# Patient Record
Sex: Male | Born: 1965 | Race: White | Hispanic: No | Marital: Married | Smoking: Former smoker
Health system: Southern US, Community
[De-identification: ages and names within clinical notes are randomized; demographics above are authoritative.]

## PROBLEM LIST (undated history)

## (undated) DIAGNOSIS — D332 Benign neoplasm of brain, unspecified: Secondary | ICD-10-CM

## (undated) DIAGNOSIS — I10 Essential (primary) hypertension: Secondary | ICD-10-CM

## (undated) DIAGNOSIS — F419 Anxiety disorder, unspecified: Secondary | ICD-10-CM

## (undated) HISTORY — DX: Anxiety disorder, unspecified: F41.9

## (undated) HISTORY — PX: NECK SURGERY: SHX720

## (undated) HISTORY — PX: HIP SURGERY: SHX245

## (undated) HISTORY — PX: VENTRICULOPERITONEAL SHUNT: SHX204

## (undated) HISTORY — PX: BRAIN SURGERY: SHX531

---

## 2004-09-01 ENCOUNTER — Emergency Department: Payer: Self-pay | Admitting: Emergency Medicine

## 2005-07-14 ENCOUNTER — Emergency Department: Payer: Self-pay | Admitting: Internal Medicine

## 2006-08-31 ENCOUNTER — Emergency Department: Payer: Self-pay | Admitting: Emergency Medicine

## 2006-11-07 ENCOUNTER — Inpatient Hospital Stay (HOSPITAL_COMMUNITY): Admission: AD | Admit: 2006-11-07 | Discharge: 2006-11-14 | Payer: Self-pay | Admitting: Psychiatry

## 2006-11-07 ENCOUNTER — Ambulatory Visit: Payer: Self-pay | Admitting: Psychiatry

## 2006-11-08 ENCOUNTER — Emergency Department (HOSPITAL_COMMUNITY): Admission: EM | Admit: 2006-11-08 | Discharge: 2006-11-08 | Payer: Self-pay | Admitting: Emergency Medicine

## 2006-12-04 ENCOUNTER — Emergency Department (HOSPITAL_COMMUNITY): Admission: EM | Admit: 2006-12-04 | Discharge: 2006-12-04 | Payer: Self-pay | Admitting: Emergency Medicine

## 2006-12-22 ENCOUNTER — Emergency Department: Payer: Self-pay | Admitting: Emergency Medicine

## 2007-01-10 ENCOUNTER — Ambulatory Visit: Payer: Self-pay | Admitting: Psychiatry

## 2007-01-10 ENCOUNTER — Inpatient Hospital Stay (HOSPITAL_COMMUNITY): Admission: AD | Admit: 2007-01-10 | Discharge: 2007-01-20 | Payer: Self-pay | Admitting: Psychiatry

## 2007-03-06 ENCOUNTER — Emergency Department: Payer: Self-pay

## 2007-04-14 ENCOUNTER — Emergency Department: Payer: Self-pay | Admitting: Emergency Medicine

## 2007-05-12 ENCOUNTER — Emergency Department: Payer: Self-pay | Admitting: Emergency Medicine

## 2007-06-17 ENCOUNTER — Emergency Department: Payer: Self-pay | Admitting: Emergency Medicine

## 2007-07-23 ENCOUNTER — Inpatient Hospital Stay: Payer: Self-pay | Admitting: Psychiatry

## 2007-08-09 ENCOUNTER — Emergency Department: Payer: Self-pay | Admitting: Emergency Medicine

## 2007-09-14 ENCOUNTER — Emergency Department: Payer: Self-pay

## 2007-09-19 ENCOUNTER — Other Ambulatory Visit: Payer: Self-pay

## 2007-09-19 ENCOUNTER — Inpatient Hospital Stay: Payer: Self-pay | Admitting: Unknown Physician Specialty

## 2008-02-04 ENCOUNTER — Emergency Department: Payer: Self-pay | Admitting: Emergency Medicine

## 2008-02-12 ENCOUNTER — Observation Stay: Payer: Self-pay | Admitting: Psychiatry

## 2008-03-05 ENCOUNTER — Emergency Department (HOSPITAL_COMMUNITY): Admission: EM | Admit: 2008-03-05 | Discharge: 2008-03-05 | Payer: Self-pay | Admitting: Emergency Medicine

## 2008-03-08 ENCOUNTER — Emergency Department (HOSPITAL_COMMUNITY): Admission: EM | Admit: 2008-03-08 | Discharge: 2008-03-08 | Payer: Self-pay | Admitting: Emergency Medicine

## 2009-10-08 ENCOUNTER — Emergency Department: Payer: Self-pay | Admitting: Unknown Physician Specialty

## 2009-10-08 ENCOUNTER — Emergency Department (HOSPITAL_COMMUNITY): Admission: EM | Admit: 2009-10-08 | Discharge: 2009-10-08 | Payer: Self-pay | Admitting: Emergency Medicine

## 2009-12-06 ENCOUNTER — Emergency Department: Payer: Self-pay | Admitting: Emergency Medicine

## 2009-12-08 ENCOUNTER — Inpatient Hospital Stay: Payer: Self-pay | Admitting: Psychiatry

## 2009-12-27 ENCOUNTER — Emergency Department: Payer: Self-pay | Admitting: Emergency Medicine

## 2009-12-31 ENCOUNTER — Emergency Department (HOSPITAL_COMMUNITY): Admission: EM | Admit: 2009-12-31 | Discharge: 2009-12-31 | Payer: Self-pay | Admitting: Emergency Medicine

## 2010-02-06 ENCOUNTER — Emergency Department: Payer: Self-pay | Admitting: Emergency Medicine

## 2010-03-08 ENCOUNTER — Emergency Department: Payer: Self-pay | Admitting: Emergency Medicine

## 2010-07-31 NOTE — Discharge Summary (Signed)
[name], [name]              ACCOUNT NO.:  [id number]   MEDICAL RECORD NO.:  [id number]          PATIENT TYPE:  IPS   LOCATION:  0604                          FACILITY:  [facility]   PHYSICIAN:  Jasmine Pang, M.D. DATE OF BIRTH:  [birth year]   DATE OF ADMISSION:  01/10/2007  DATE OF DISCHARGE:  01/20/2007                               DISCHARGE SUMMARY   IDENTIFICATION:  This is a voluntary admission to my service on January 10, 2007 for this 45 year old separated white male.   HISTORY OF PRESENT ILLNESS:  The patient presented yesterday as a walk-  in.  He reports he was having suicidal ideation again actually this is  because he was having command hallucinations.  He states he has been  using OxyContin again to self medicate.  He lost his wife, his home, and  his job within the past 6-8 months.  He states he recently found out  that his estranged wife is with another man.  Apparently, she had been  with him the whole time.  He lost his home to foreclosure.  He called  the hotline at [facility], and they advised the patient to come to  [facility].  He was last with [place] from November 07, 2006 to  November 14, 2006.  He had the same complaints at that time.  The only new  thing is that his primary physician is no longer prescribing opioids,  and he is buying them off the street.  He states he has an aunt who  abuses opioids.  He has a grandfather and uncle who abused alcohol.  His  own history is that he began using OxyContin at the age of 42.  This was  due to a brain injury.  He had a benign tumor removed at age 78 and age  85.  He then suffered fracture of the C1, C2, and C3 about 10 years ago  and began using oxycodone then.  His psychiatrist is Dr. Elesa Massed and he is  followed by Dr. Newell Coral, a neurosurgeon.  His primary care doctor is  Dr. Lacie Scotts.  He is status post resection of a benign brain tumor at  the age of 25 and 4.  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  It was checked about 6 months ago by Dr. Newell Coral and is patent.  He also suffers migraines and has a history of fracture for C1, C2, and  C3 as indicated above.  He is currently prescribed Protonix 40 mg daily,  Risperdal M-Tabs 0.5 mg p.o. b.i.d..  Risperdal M-Tabs 1 mg q.h.s.,  Celexa 40 mg daily, Relafen 750 mg b.i.d., Flexeril 10 mg t.i.d., Ambien  10 mg q.h.s., Xanax 0.5 mg p.o. t.i.d., Ultracet q.6h. p.r.n. pain,  Zyprexa 15 mg p.o. q.h.s., and Benadryl 25 mg p.o. q.h.s.   PHYSICAL FINDINGS:  The patient's physical exam was grossly within  normal limits.  There were no acute physical or medical problems noted.   ADMISSION LABORATORIES:  Comprehensive metabolic panel was remarkable  for potassium of 3.4 (3.5-5.1).  Glucose elevated at 111.  The  urinalysis was negative.  TSH was 0.409, which was within normal limits.  CBC was grossly within normal limits except for slightly decreased  hematocrit of 38.7 and magnesium was 2.5, which was within normal range.   HOSPITAL COURSE:  Upon admission, the patient was continued on Protonix  40 mg p.o. daily, Celexa 40 mg p.o. daily, Relafen 750 mg p.o. b.i.d.,  Flexeril 10 mg p.o. t.i.d., Ambien 10 mg p.o. q.h.s. p.r.n. insomnia,  Zyprexa 15 mg p.o. q.h.s., and Benadryl 25 mg p.o. q.h.s.  He was also  started on the Librium detox protocol due to his abuse of  benzodiazepines.  He was started on the clonidine detox protocol due to  his opiate abuse.  He was started on ibuprofen 600 mg t.i.d. p.r.n.  pain.  He was started on Zyprexa Zydis 5 mg p.o. q.6h. p.r.n. anxiety or  agitation..  On January 13, 2007, Zyprexa was discontinued.  He was  started on Seroquel 50 mg p.o. q.6h. p.r.n. hallucinations.  He was also  started on Seroquel 100 mg p.o. q.h.s.  On January 14, 2007, Seroquel  was increased to 100 mg p.o. b.i.d. and 100 mg q.h.s.  On January 15, 2007, Seroquel was increased to 150 mg p.o. b.i.d. and 150 mg at  bedtime.  On  January 16, 2007, Seroquel was increased to 200 mg p.o.  q.i.d.  The patient had requested these increases due to continued  auditory hallucinations.  The patient was friendly and cooperative in  sessions with me.  He did endorse auditory hallucinations.  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  He also lost his wife shortly afterwards and lost his home  to foreclosure.  He is living in one of his parents' home at this time.  He discussed his medical problems, which have been difficult for him  pain wise.  He admitted to suicidal ideation with voices telling him to  kill himself.  He stated that initially he complained that the voices  are bad.  There was saying things like shoot youself and get some  pills.  He stated they were telling him he should not be here.  This  continued as his hospitalization progressed.  On January 15, 2007, he  was finally becoming less depressed and less anxious.  The auditory  hallucinations were less with the increased Seroquel, but he still  requested an increased dose in a Seroquel as indicated above.  On  January 17, 2007, the patient was feeling that the voices were getting  better.  He had questions about when he could go home.  His sleep was  good.  His appetite was good.  As hospitalization progressed, there were  no significant auditory hallucinations.  He stated they have become  internal instead of external.  There was no suicidal or homicidal  ideation.  On January 19, 2007, he stated he felt better like somebody  turned on the light switch.  On January 20, 2007, mental status had  improved markedly from admission status.  The patient was less  depressed.  He was friendly and cooperative with good eye contact.  Speech was normal rate and flow.  Psychomotor activity was within normal  limits.  Mood was euthymic.  Affect wide range.  There was no suicidal  or homicidal ideation.  No thoughts of  self-injurious behavior.  No  auditory or visual hallucinations.  No paranoia or delusions.  Thoughts  were logical and goal directed.  Cognitive was grossly back to baseline.  It was felt the patient was safe to be discharged today.  He was going  to return to his parents' extra home that he has been living in.  He has  a roommate who is a support for him.   DISCHARGE DIAGNOSES:  AXIS I:  Psychotic disorder, not otherwise  specified.  Polysubstance dependence.  AXIS II:  None.  AXIS III:  Status post benign brain tumor resection ages 14 and 37,  status post fracture C1, C2, and C3 10 years ago, migraine headaches.  AXIS IV:  Severe (problems with primary support group, education,  occupation, housing, economic, medical problems, and burden of  psychiatric illness).  AXIS V:  Global assessment of functioning was 50 upon discharge.  Global  assessment of functioning was 30 upon admission.  Global assessment of  functioning was 65 highest past year.   DISCHARGE PLAN:  There were no specific activity level or dietary  restrictions.   POST-HOSPITAL CARE PLANS:  The patient will be seen by his psychiatrist,  Dr. Elesa Massed on January 20, 2007, at 3 o'clock p.m.  He will also be seen  soon by his therapist, Robet Leu.  This appointment is being arranged  by the case manager.   DISCHARGE MEDICATIONS:  1. Benadryl 25 mg p.o. q.h.s.  2. Flexeril 10 mg p.o. t.i.d.  3. Celexa 40 mg daily.  4. Protonix 40 mg daily with food.  5. Relafen 750 mg twice daily.  6. Seroquel 200 mg p.o. t.i.d. and Seroquel 50 mg p.o. q.6h. p.r.n.      anxiety or hallucinations.  7. Ambien 10 mg p.o. q.h.s. p.r.n.      Jasmine Pang, M.D.  Electronically Signed    BHS/MEDQ  D:  01/20/2007  T:  01/20/2007  Job:  [id number]

## 2010-07-31 NOTE — H&P (Signed)
[name], [name]              ACCOUNT NO.:  [id number]   MEDICAL RECORD NO.:  [id number]          PATIENT TYPE:  IPS   LOCATION:  0604                          FACILITY:  [facility]   PHYSICIAN:  Vic Ripper, P.A.-C.DATE OF BIRTH:  Nov 08, 1965   DATE OF ADMISSION:  01/10/2007  DATE OF DISCHARGE:                       PSYCHIATRIC ADMISSION ASSESSMENT   This is a voluntary admission to the services of Dr. Geoffery Lyons.   IDENTIFYING INFORMATION:  This is a 45 year old separated white male. He  presented yesterday as a walk-in. He reported he was having suicidal  ideation again. Actually this is because he is having command auditory  hallucinations. He states he has been using OxyContin again to self-  medicate. He lost his wife, his home, his job within the past six to  eight months. He states that he recently found out that his estranged  wife is with another man. Apparently she had him the whole time.  Lost  his home to foreclosure. He called the hot line at [facility] and they  advised the patient to come to the [facility].  He was  last with [place] 8/22 to 8/29. He had the same complaints at that time so  there is actually no new information. The only thing new is that his  primary physician is no longer prescribing opioids and he is buying off  the street.   PAST PSYCHIATRIC HISTORY:  As already stated, he was with [place] back in  8/22 to 8/29.   SOCIAL HISTORY:  He has completed one semester of community college. His  first marriage lasted about three years. He has a 81 year old daughter  and a 8 year old son from that marriage. They stay with his parents.  His second marriage lasted seven years. He has a step son 8. He was  employed as a Geneticist, molecular. He has not had that employment for eight  months now, that is when he lost his home to foreclosure, became  separated and began having depression with auditory hallucinations.   FAMILY HISTORY:  His aunt abuses  opioids. His grandfather and uncle  abused alcohol. His own history is that he began using OxyContin at age  45. This was due to brain surgery. He had a benign tumor removed at age  45 and age 74 and then he suffered a fracture of C1, 2 and 3 about 10  years ago and has been using Oxycodone since.  His primary care Brittainy Bucker  is Dr. Lacie Scotts. His psychiatrist is Dr. Elesa Massed and he is followed by Dr.  Newell Coral of neurological.   MEDICAL PROBLEMS:  He is status post resection of benign brain tumor age  45 and 87. 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 Newell Coral and it is patent.  He also suffers migraines and he has a history for a fracture of C1, 2  and 3 ten years ago.   MEDICATIONS:  He reports that he is currently prescribed Protonix 40 mg  one p.o. daily, Risperdal M tabs 0.5 mg p.o.  b.i.d., Risperdal M tabs 1  mg p.o. h.s.,  Celexa 40 mg p.o. daily, Relafen 750 mg p.o. b.i.d.,  Flexeril 10 mg p.o. t.i.d., Ambien 10 mg h.s., Xanax 0.5 mg p.o. t.i.d.,  Ultracet no dosage p.o. q.6h, Zyprexa 15 mg p.o. h.s. and Benadryl 25 mg  p.o. at h.s.   POSITIVE PHYSICAL EXAMINATION:  His labs were basically unremarkable.  His vital signs on admission showed he was 63 inches tall. Weighs 148.  Blood pressure is 132/92 to 136/94. He had no evidence for withdrawal.  The remainder of the physical examination is unremarkable.  He is  essentially well-groomed and nourished and appears his stated age.   REVIEW OF SYSTEMS:  Was unremarkable on today's exam.   MENTAL STATUS EXAM:  He is alert and oriented. He is appropriately  groomed, well-dressed and nourished. His speech is normal rate, rhythm  and tone. Mood: He reports depression. His affect is not congruent to  the amount of depression that he reports. Thought processes are clear,  rational and oriented. He wants to get rid of auditory hallucinations.  He wants to get some source of  income. His judgment and insight are  good. His concentration and memory are good. He is suicidal. He is not  homicidal. He does report auditory hallucinations command to hurt  himself.   AXIS I:  Opioid abuse, major depressive disorder, recurrent severe with  psychotic features, auditory hallucinations.   AXIS II:  Deferred.   AXIS III:  Status post benign brain tumor resection ages 86 and 40,  status post fracture C1, 2 and 3 ten years ago. Migraines.   AXIS IV:  Severe problems of primary support group education,  occupation, housing, economic and medical problems.   AXIS V:  30.   PLAN:  Is to admit for safety and stabilization. We will adjust his meds  to stop auditory hallucinations and we can consider asking Dr. Newell Coral  if there is any possibility that Mr. Treadway suffers from traumatic  brain injury.      Vic Ripper, P.A.-C.     MD/MEDQ  D:  01/11/2007  T:  01/12/2007  Job:  [id number]
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
## 2010-09-25 ENCOUNTER — Emergency Department: Payer: Self-pay | Admitting: Emergency Medicine

## 2010-10-11 ENCOUNTER — Inpatient Hospital Stay: Payer: Self-pay | Admitting: Internal Medicine

## 2010-12-10 ENCOUNTER — Emergency Department: Payer: Self-pay | Admitting: *Deleted

## 2010-12-17 ENCOUNTER — Emergency Department: Payer: Self-pay | Admitting: Emergency Medicine

## 2010-12-26 LAB — URINALYSIS, ROUTINE W REFLEX MICROSCOPIC
Bilirubin Urine: NEGATIVE
Bilirubin Urine: NEGATIVE
Glucose, UA: 250 — AB
Glucose, UA: NEGATIVE
Hgb urine dipstick: NEGATIVE
Hgb urine dipstick: NEGATIVE
Ketones, ur: NEGATIVE
Ketones, ur: NEGATIVE
Nitrite: NEGATIVE
Nitrite: NEGATIVE
Protein, ur: NEGATIVE
Protein, ur: NEGATIVE
Specific Gravity, Urine: 1.005
Specific Gravity, Urine: 1.017
Urobilinogen, UA: 0.2
Urobilinogen, UA: 1
pH: 5.5
pH: 6.5

## 2010-12-26 LAB — COMPREHENSIVE METABOLIC PANEL
ALT: 18
AST: 17
Albumin: 3.8
Alkaline Phosphatase: 63
BUN: 8
CO2: 22
Calcium: 8.9
Chloride: 108
Creatinine, Ser: 0.87
GFR calc Af Amer: >60
GFR calc non Af Amer: >60
Glucose, Bld: 111 — ABNORMAL HIGH
Potassium: 3.4 — ABNORMAL LOW
Sodium: 137
Total Bilirubin: 0.6
Total Protein: 6.6

## 2010-12-26 LAB — CBC
HCT: 38.7 — ABNORMAL LOW
Hemoglobin: 13.4
MCHC: 34.5
MCV: 91.9
Platelets: 280
RBC: 4.22
RDW: 13.7
WBC: 6.6

## 2010-12-26 LAB — SALICYLATE LEVEL: Salicylate Lvl: <4

## 2010-12-26 LAB — TSH: TSH: 0.409

## 2010-12-26 LAB — ACETAMINOPHEN LEVEL: Acetaminophen (Tylenol), Serum: <10 — ABNORMAL LOW

## 2010-12-26 LAB — MAGNESIUM: Magnesium: 2.5

## 2010-12-28 LAB — DRUGS OF ABUSE SCREEN W/O ALC, ROUTINE URINE
Amphetamine Screen, Ur: NEGATIVE
Barbiturate Quant, Ur: NEGATIVE
Benzodiazepines.: POSITIVE — AB
Cocaine Metabolites: NEGATIVE
Creatinine,U: 358.6
Marijuana Metabolite: NEGATIVE
Methadone: NEGATIVE
Opiate Screen, Urine: NEGATIVE
Phencyclidine (PCP): NEGATIVE
Propoxyphene: NEGATIVE

## 2010-12-28 LAB — COMPREHENSIVE METABOLIC PANEL
ALT: 118 — ABNORMAL HIGH
AST: 30
AST: 38 — ABNORMAL HIGH
Albumin: 3.6
Albumin: 3.7
Alkaline Phosphatase: 59
Alkaline Phosphatase: 64
BUN: 13
BUN: 17
CO2: 24
Calcium: 9.2
Chloride: 101
Chloride: 106
Creatinine, Ser: 1.22
GFR calc Af Amer: >60
GFR calc non Af Amer: >60
Glucose, Bld: 165 — ABNORMAL HIGH
Potassium: 3.4 — ABNORMAL LOW
Potassium: 3.7
Sodium: 135
Total Bilirubin: 0.7
Total Bilirubin: 0.8
Total Protein: 6.9

## 2010-12-28 LAB — CBC
HCT: 39.8
HCT: 40.5
Hemoglobin: 13.8
MCHC: 34.7
MCV: 90.5
Platelets: 278
Platelets: 292
RBC: 4.4
RDW: 14.2 — ABNORMAL HIGH
WBC: 5.6
WBC: 7.1

## 2010-12-28 LAB — URINALYSIS, ROUTINE W REFLEX MICROSCOPIC
Glucose, UA: 500 — AB
Ketones, ur: 15 — AB
Leukocytes, UA: NEGATIVE
Nitrite: NEGATIVE
Protein, ur: 30 — AB
Specific Gravity, Urine: 1.042 — ABNORMAL HIGH
Urobilinogen, UA: 1
pH: 5.5

## 2010-12-28 LAB — BENZODIAZEPINE, QUANTITATIVE, URINE
Alprazolam (GC/LC/MS), ur confirm: 190 ng/mL
Flurazepam GC/MS Conf: NEGATIVE
Nordiazepam GC/MS Conf: NEGATIVE
Oxazepam GC/MS Conf: NEGATIVE

## 2010-12-28 LAB — URINE MICROSCOPIC-ADD ON

## 2010-12-28 LAB — DIFFERENTIAL
Basophils Absolute: 0
Basophils Relative: 0
Eosinophils Relative: 1
Monocytes Absolute: 0.3

## 2011-01-18 ENCOUNTER — Emergency Department: Payer: Self-pay | Admitting: Internal Medicine

## 2011-02-13 ENCOUNTER — Emergency Department: Payer: Self-pay | Admitting: Emergency Medicine

## 2011-03-25 DIAGNOSIS — S335XXA Sprain of ligaments of lumbar spine, initial encounter: Secondary | ICD-10-CM | POA: Diagnosis not present

## 2011-03-25 DIAGNOSIS — M533 Sacrococcygeal disorders, not elsewhere classified: Secondary | ICD-10-CM | POA: Diagnosis not present

## 2011-03-25 DIAGNOSIS — M545 Low back pain, unspecified: Secondary | ICD-10-CM | POA: Diagnosis not present

## 2011-03-25 DIAGNOSIS — Z79899 Other long term (current) drug therapy: Secondary | ICD-10-CM | POA: Diagnosis not present

## 2011-03-25 DIAGNOSIS — M5137 Other intervertebral disc degeneration, lumbosacral region: Secondary | ICD-10-CM | POA: Diagnosis not present

## 2011-03-25 DIAGNOSIS — G541 Lumbosacral plexus disorders: Secondary | ICD-10-CM | POA: Diagnosis not present

## 2011-03-25 DIAGNOSIS — M542 Cervicalgia: Secondary | ICD-10-CM | POA: Diagnosis not present

## 2011-03-26 ENCOUNTER — Ambulatory Visit: Payer: Self-pay | Admitting: Gastroenterology

## 2011-03-26 DIAGNOSIS — Z8711 Personal history of peptic ulcer disease: Secondary | ICD-10-CM | POA: Diagnosis not present

## 2011-03-26 DIAGNOSIS — R1013 Epigastric pain: Secondary | ICD-10-CM | POA: Diagnosis not present

## 2011-03-26 DIAGNOSIS — K2960 Other gastritis without bleeding: Secondary | ICD-10-CM | POA: Diagnosis not present

## 2011-03-26 DIAGNOSIS — K269 Duodenal ulcer, unspecified as acute or chronic, without hemorrhage or perforation: Secondary | ICD-10-CM | POA: Diagnosis not present

## 2011-03-26 DIAGNOSIS — K2100 Gastro-esophageal reflux disease with esophagitis, without bleeding: Secondary | ICD-10-CM | POA: Diagnosis not present

## 2011-03-26 DIAGNOSIS — K2970 Gastritis, unspecified, without bleeding: Secondary | ICD-10-CM | POA: Diagnosis not present

## 2011-03-26 DIAGNOSIS — I10 Essential (primary) hypertension: Secondary | ICD-10-CM | POA: Diagnosis not present

## 2011-03-26 DIAGNOSIS — Z79899 Other long term (current) drug therapy: Secondary | ICD-10-CM | POA: Diagnosis not present

## 2011-03-26 DIAGNOSIS — F17200 Nicotine dependence, unspecified, uncomplicated: Secondary | ICD-10-CM | POA: Diagnosis not present

## 2011-03-26 DIAGNOSIS — Z880 Allergy status to penicillin: Secondary | ICD-10-CM | POA: Diagnosis not present

## 2011-03-26 DIAGNOSIS — K625 Hemorrhage of anus and rectum: Secondary | ICD-10-CM | POA: Diagnosis not present

## 2011-03-26 DIAGNOSIS — K2980 Duodenitis without bleeding: Secondary | ICD-10-CM | POA: Diagnosis not present

## 2011-03-26 DIAGNOSIS — K219 Gastro-esophageal reflux disease without esophagitis: Secondary | ICD-10-CM | POA: Diagnosis not present

## 2011-03-28 LAB — PATHOLOGY REPORT

## 2011-03-30 ENCOUNTER — Ambulatory Visit: Payer: Self-pay | Admitting: Anesthesiology

## 2011-03-30 DIAGNOSIS — M5126 Other intervertebral disc displacement, lumbar region: Secondary | ICD-10-CM | POA: Diagnosis not present

## 2011-03-30 DIAGNOSIS — M5030 Other cervical disc degeneration, unspecified cervical region: Secondary | ICD-10-CM | POA: Diagnosis not present

## 2011-03-30 DIAGNOSIS — M5020 Other cervical disc displacement, unspecified cervical region: Secondary | ICD-10-CM | POA: Diagnosis not present

## 2011-03-30 DIAGNOSIS — M47817 Spondylosis without myelopathy or radiculopathy, lumbosacral region: Secondary | ICD-10-CM | POA: Diagnosis not present

## 2011-03-30 DIAGNOSIS — M5137 Other intervertebral disc degeneration, lumbosacral region: Secondary | ICD-10-CM | POA: Diagnosis not present

## 2011-04-01 DIAGNOSIS — G894 Chronic pain syndrome: Secondary | ICD-10-CM | POA: Diagnosis not present

## 2011-04-01 DIAGNOSIS — G43119 Migraine with aura, intractable, without status migrainosus: Secondary | ICD-10-CM | POA: Diagnosis not present

## 2011-04-01 DIAGNOSIS — Z87891 Personal history of nicotine dependence: Secondary | ICD-10-CM | POA: Diagnosis not present

## 2011-04-01 DIAGNOSIS — K250 Acute gastric ulcer with hemorrhage: Secondary | ICD-10-CM | POA: Diagnosis not present

## 2011-04-08 DIAGNOSIS — K250 Acute gastric ulcer with hemorrhage: Secondary | ICD-10-CM | POA: Diagnosis not present

## 2011-04-08 DIAGNOSIS — G43119 Migraine with aura, intractable, without status migrainosus: Secondary | ICD-10-CM | POA: Diagnosis not present

## 2011-04-08 DIAGNOSIS — G894 Chronic pain syndrome: Secondary | ICD-10-CM | POA: Diagnosis not present

## 2011-05-03 DIAGNOSIS — M5137 Other intervertebral disc degeneration, lumbosacral region: Secondary | ICD-10-CM | POA: Diagnosis not present

## 2011-05-03 DIAGNOSIS — M545 Low back pain, unspecified: Secondary | ICD-10-CM | POA: Diagnosis not present

## 2011-05-03 DIAGNOSIS — G541 Lumbosacral plexus disorders: Secondary | ICD-10-CM | POA: Diagnosis not present

## 2011-05-03 DIAGNOSIS — M533 Sacrococcygeal disorders, not elsewhere classified: Secondary | ICD-10-CM | POA: Diagnosis not present

## 2011-05-21 DIAGNOSIS — Z87891 Personal history of nicotine dependence: Secondary | ICD-10-CM | POA: Diagnosis not present

## 2011-05-21 DIAGNOSIS — G894 Chronic pain syndrome: Secondary | ICD-10-CM | POA: Diagnosis not present

## 2011-05-21 DIAGNOSIS — F411 Generalized anxiety disorder: Secondary | ICD-10-CM | POA: Diagnosis not present

## 2011-05-21 DIAGNOSIS — M5430 Sciatica, unspecified side: Secondary | ICD-10-CM | POA: Diagnosis not present

## 2011-05-29 DIAGNOSIS — M545 Low back pain, unspecified: Secondary | ICD-10-CM | POA: Diagnosis not present

## 2011-05-29 DIAGNOSIS — M533 Sacrococcygeal disorders, not elsewhere classified: Secondary | ICD-10-CM | POA: Diagnosis not present

## 2011-05-29 DIAGNOSIS — G541 Lumbosacral plexus disorders: Secondary | ICD-10-CM | POA: Diagnosis not present

## 2011-05-29 DIAGNOSIS — M5137 Other intervertebral disc degeneration, lumbosacral region: Secondary | ICD-10-CM | POA: Diagnosis not present

## 2011-06-19 DIAGNOSIS — G43119 Migraine with aura, intractable, without status migrainosus: Secondary | ICD-10-CM | POA: Diagnosis not present

## 2011-06-19 DIAGNOSIS — M545 Low back pain, unspecified: Secondary | ICD-10-CM | POA: Diagnosis not present

## 2011-06-19 DIAGNOSIS — G894 Chronic pain syndrome: Secondary | ICD-10-CM | POA: Diagnosis not present

## 2011-06-19 DIAGNOSIS — I10 Essential (primary) hypertension: Secondary | ICD-10-CM | POA: Diagnosis not present

## 2011-07-04 DIAGNOSIS — J309 Allergic rhinitis, unspecified: Secondary | ICD-10-CM | POA: Diagnosis not present

## 2011-07-04 DIAGNOSIS — I10 Essential (primary) hypertension: Secondary | ICD-10-CM | POA: Diagnosis not present

## 2011-07-04 DIAGNOSIS — G43119 Migraine with aura, intractable, without status migrainosus: Secondary | ICD-10-CM | POA: Diagnosis not present

## 2011-07-10 DIAGNOSIS — R51 Headache: Secondary | ICD-10-CM | POA: Diagnosis not present

## 2011-07-10 DIAGNOSIS — I10 Essential (primary) hypertension: Secondary | ICD-10-CM | POA: Diagnosis not present

## 2011-07-10 DIAGNOSIS — Z982 Presence of cerebrospinal fluid drainage device: Secondary | ICD-10-CM | POA: Diagnosis not present

## 2011-07-10 DIAGNOSIS — Z4541 Encounter for adjustment and management of cerebrospinal fluid drainage device: Secondary | ICD-10-CM | POA: Diagnosis not present

## 2011-07-10 DIAGNOSIS — R112 Nausea with vomiting, unspecified: Secondary | ICD-10-CM | POA: Diagnosis not present

## 2011-07-11 DIAGNOSIS — K219 Gastro-esophageal reflux disease without esophagitis: Secondary | ICD-10-CM | POA: Diagnosis not present

## 2011-07-11 DIAGNOSIS — Z880 Allergy status to penicillin: Secondary | ICD-10-CM | POA: Diagnosis not present

## 2011-07-11 DIAGNOSIS — Z79899 Other long term (current) drug therapy: Secondary | ICD-10-CM | POA: Diagnosis not present

## 2011-07-11 DIAGNOSIS — R51 Headache: Secondary | ICD-10-CM | POA: Diagnosis not present

## 2011-07-11 DIAGNOSIS — F411 Generalized anxiety disorder: Secondary | ICD-10-CM | POA: Diagnosis not present

## 2011-07-11 DIAGNOSIS — I10 Essential (primary) hypertension: Secondary | ICD-10-CM | POA: Diagnosis not present

## 2011-07-17 ENCOUNTER — Emergency Department: Payer: Self-pay | Admitting: Emergency Medicine

## 2011-07-17 DIAGNOSIS — Z982 Presence of cerebrospinal fluid drainage device: Secondary | ICD-10-CM | POA: Diagnosis not present

## 2011-07-17 DIAGNOSIS — G43909 Migraine, unspecified, not intractable, without status migrainosus: Secondary | ICD-10-CM | POA: Diagnosis not present

## 2011-07-17 DIAGNOSIS — F17200 Nicotine dependence, unspecified, uncomplicated: Secondary | ICD-10-CM | POA: Diagnosis not present

## 2011-07-17 DIAGNOSIS — Z86011 Personal history of benign neoplasm of the brain: Secondary | ICD-10-CM | POA: Diagnosis not present

## 2011-07-17 DIAGNOSIS — R112 Nausea with vomiting, unspecified: Secondary | ICD-10-CM | POA: Diagnosis not present

## 2011-07-17 DIAGNOSIS — Z79899 Other long term (current) drug therapy: Secondary | ICD-10-CM | POA: Diagnosis not present

## 2011-07-17 DIAGNOSIS — R51 Headache: Secondary | ICD-10-CM | POA: Diagnosis not present

## 2011-07-17 DIAGNOSIS — R1110 Vomiting, unspecified: Secondary | ICD-10-CM | POA: Diagnosis not present

## 2011-07-18 DIAGNOSIS — F411 Generalized anxiety disorder: Secondary | ICD-10-CM | POA: Diagnosis not present

## 2011-07-18 DIAGNOSIS — G43119 Migraine with aura, intractable, without status migrainosus: Secondary | ICD-10-CM | POA: Diagnosis not present

## 2011-07-18 DIAGNOSIS — R112 Nausea with vomiting, unspecified: Secondary | ICD-10-CM | POA: Diagnosis not present

## 2011-07-18 DIAGNOSIS — G894 Chronic pain syndrome: Secondary | ICD-10-CM | POA: Diagnosis not present

## 2011-08-05 DIAGNOSIS — M545 Low back pain, unspecified: Secondary | ICD-10-CM | POA: Diagnosis not present

## 2011-08-05 DIAGNOSIS — G43119 Migraine with aura, intractable, without status migrainosus: Secondary | ICD-10-CM | POA: Diagnosis not present

## 2011-08-05 DIAGNOSIS — G894 Chronic pain syndrome: Secondary | ICD-10-CM | POA: Diagnosis not present

## 2011-08-27 DIAGNOSIS — G894 Chronic pain syndrome: Secondary | ICD-10-CM | POA: Diagnosis not present

## 2011-08-27 DIAGNOSIS — M545 Low back pain, unspecified: Secondary | ICD-10-CM | POA: Diagnosis not present

## 2011-08-27 DIAGNOSIS — G43119 Migraine with aura, intractable, without status migrainosus: Secondary | ICD-10-CM | POA: Diagnosis not present

## 2011-09-24 DIAGNOSIS — G894 Chronic pain syndrome: Secondary | ICD-10-CM | POA: Diagnosis not present

## 2011-09-24 DIAGNOSIS — G43119 Migraine with aura, intractable, without status migrainosus: Secondary | ICD-10-CM | POA: Diagnosis not present

## 2011-10-02 DIAGNOSIS — R112 Nausea with vomiting, unspecified: Secondary | ICD-10-CM | POA: Diagnosis not present

## 2011-10-02 DIAGNOSIS — R1110 Vomiting, unspecified: Secondary | ICD-10-CM | POA: Diagnosis not present

## 2011-10-02 DIAGNOSIS — R42 Dizziness and giddiness: Secondary | ICD-10-CM | POA: Diagnosis not present

## 2011-10-02 DIAGNOSIS — R51 Headache: Secondary | ICD-10-CM | POA: Diagnosis not present

## 2011-10-24 DIAGNOSIS — G43119 Migraine with aura, intractable, without status migrainosus: Secondary | ICD-10-CM | POA: Diagnosis not present

## 2011-10-24 DIAGNOSIS — I10 Essential (primary) hypertension: Secondary | ICD-10-CM | POA: Diagnosis not present

## 2011-10-24 DIAGNOSIS — G894 Chronic pain syndrome: Secondary | ICD-10-CM | POA: Diagnosis not present

## 2011-11-21 DIAGNOSIS — I10 Essential (primary) hypertension: Secondary | ICD-10-CM | POA: Diagnosis not present

## 2011-11-21 DIAGNOSIS — G43119 Migraine with aura, intractable, without status migrainosus: Secondary | ICD-10-CM | POA: Diagnosis not present

## 2011-11-21 DIAGNOSIS — G894 Chronic pain syndrome: Secondary | ICD-10-CM | POA: Diagnosis not present

## 2011-12-16 DIAGNOSIS — G894 Chronic pain syndrome: Secondary | ICD-10-CM | POA: Diagnosis not present

## 2011-12-16 DIAGNOSIS — F411 Generalized anxiety disorder: Secondary | ICD-10-CM | POA: Diagnosis not present

## 2011-12-16 DIAGNOSIS — I10 Essential (primary) hypertension: Secondary | ICD-10-CM | POA: Diagnosis not present

## 2011-12-16 DIAGNOSIS — G43119 Migraine with aura, intractable, without status migrainosus: Secondary | ICD-10-CM | POA: Diagnosis not present

## 2012-02-20 DIAGNOSIS — G894 Chronic pain syndrome: Secondary | ICD-10-CM | POA: Diagnosis not present

## 2012-02-20 DIAGNOSIS — G43119 Migraine with aura, intractable, without status migrainosus: Secondary | ICD-10-CM | POA: Diagnosis not present

## 2012-02-20 DIAGNOSIS — I129 Hypertensive chronic kidney disease with stage 1 through stage 4 chronic kidney disease, or unspecified chronic kidney disease: Secondary | ICD-10-CM | POA: Diagnosis not present

## 2012-03-19 DIAGNOSIS — G894 Chronic pain syndrome: Secondary | ICD-10-CM | POA: Diagnosis not present

## 2012-03-19 DIAGNOSIS — G43119 Migraine with aura, intractable, without status migrainosus: Secondary | ICD-10-CM | POA: Diagnosis not present

## 2012-03-19 DIAGNOSIS — I129 Hypertensive chronic kidney disease with stage 1 through stage 4 chronic kidney disease, or unspecified chronic kidney disease: Secondary | ICD-10-CM | POA: Diagnosis not present

## 2012-03-31 ENCOUNTER — Emergency Department: Payer: Self-pay | Admitting: Emergency Medicine

## 2012-03-31 DIAGNOSIS — R110 Nausea: Secondary | ICD-10-CM | POA: Diagnosis not present

## 2012-03-31 DIAGNOSIS — F329 Major depressive disorder, single episode, unspecified: Secondary | ICD-10-CM | POA: Diagnosis not present

## 2012-03-31 DIAGNOSIS — Z79899 Other long term (current) drug therapy: Secondary | ICD-10-CM | POA: Diagnosis not present

## 2012-03-31 DIAGNOSIS — G43909 Migraine, unspecified, not intractable, without status migrainosus: Secondary | ICD-10-CM | POA: Diagnosis not present

## 2012-03-31 DIAGNOSIS — F3289 Other specified depressive episodes: Secondary | ICD-10-CM | POA: Diagnosis not present

## 2012-03-31 DIAGNOSIS — F17200 Nicotine dependence, unspecified, uncomplicated: Secondary | ICD-10-CM | POA: Diagnosis not present

## 2012-03-31 DIAGNOSIS — I10 Essential (primary) hypertension: Secondary | ICD-10-CM | POA: Diagnosis not present

## 2012-03-31 LAB — CBC
HCT: 37.7 % — ABNORMAL LOW (ref 40.0–52.0)
MCH: 31.4 pg (ref 26.0–34.0)
MCHC: 33.8 g/dL (ref 32.0–36.0)
MCV: 93 fL (ref 80–100)
Platelet: 218 10*3/uL (ref 150–440)
RDW: 14.1 % (ref 11.5–14.5)
WBC: 5.5 10*3/uL (ref 3.8–10.6)

## 2012-03-31 LAB — BASIC METABOLIC PANEL
BUN: 8 mg/dL (ref 7–18)
Calcium, Total: 7.8 mg/dL — ABNORMAL LOW (ref 8.5–10.1)
Co2: 25 mmol/L (ref 21–32)
Potassium: 4.4 mmol/L (ref 3.5–5.1)
Sodium: 143 mmol/L (ref 136–145)

## 2012-03-31 LAB — SEDIMENTATION RATE: Erythrocyte Sed Rate: 5 mm/hr (ref 0–15)

## 2012-04-09 DIAGNOSIS — G43119 Migraine with aura, intractable, without status migrainosus: Secondary | ICD-10-CM | POA: Diagnosis not present

## 2012-04-09 DIAGNOSIS — G894 Chronic pain syndrome: Secondary | ICD-10-CM | POA: Diagnosis not present

## 2012-04-10 ENCOUNTER — Emergency Department: Payer: Self-pay | Admitting: Emergency Medicine

## 2012-04-10 DIAGNOSIS — F329 Major depressive disorder, single episode, unspecified: Secondary | ICD-10-CM | POA: Diagnosis not present

## 2012-04-10 DIAGNOSIS — Z79899 Other long term (current) drug therapy: Secondary | ICD-10-CM | POA: Diagnosis not present

## 2012-04-10 DIAGNOSIS — K089 Disorder of teeth and supporting structures, unspecified: Secondary | ICD-10-CM | POA: Diagnosis not present

## 2012-04-10 DIAGNOSIS — R52 Pain, unspecified: Secondary | ICD-10-CM | POA: Diagnosis not present

## 2012-04-10 DIAGNOSIS — K029 Dental caries, unspecified: Secondary | ICD-10-CM | POA: Diagnosis not present

## 2012-04-10 DIAGNOSIS — F3289 Other specified depressive episodes: Secondary | ICD-10-CM | POA: Diagnosis not present

## 2012-04-16 DIAGNOSIS — I10 Essential (primary) hypertension: Secondary | ICD-10-CM | POA: Diagnosis not present

## 2012-04-16 DIAGNOSIS — G43119 Migraine with aura, intractable, without status migrainosus: Secondary | ICD-10-CM | POA: Diagnosis not present

## 2012-04-17 DIAGNOSIS — G894 Chronic pain syndrome: Secondary | ICD-10-CM | POA: Diagnosis not present

## 2012-04-17 DIAGNOSIS — I129 Hypertensive chronic kidney disease with stage 1 through stage 4 chronic kidney disease, or unspecified chronic kidney disease: Secondary | ICD-10-CM | POA: Diagnosis not present

## 2012-04-17 DIAGNOSIS — Z23 Encounter for immunization: Secondary | ICD-10-CM | POA: Diagnosis not present

## 2012-04-26 DIAGNOSIS — K047 Periapical abscess without sinus: Secondary | ICD-10-CM | POA: Diagnosis not present

## 2012-04-26 DIAGNOSIS — K089 Disorder of teeth and supporting structures, unspecified: Secondary | ICD-10-CM | POA: Diagnosis not present

## 2012-05-15 DIAGNOSIS — R6884 Jaw pain: Secondary | ICD-10-CM | POA: Diagnosis not present

## 2012-05-15 DIAGNOSIS — K047 Periapical abscess without sinus: Secondary | ICD-10-CM | POA: Diagnosis not present

## 2012-05-17 DIAGNOSIS — K08409 Partial loss of teeth, unspecified cause, unspecified class: Secondary | ICD-10-CM | POA: Diagnosis not present

## 2012-05-17 DIAGNOSIS — K0520 Aggressive periodontitis, unspecified: Secondary | ICD-10-CM | POA: Diagnosis not present

## 2012-05-17 DIAGNOSIS — I10 Essential (primary) hypertension: Secondary | ICD-10-CM | POA: Diagnosis not present

## 2012-05-17 DIAGNOSIS — K08109 Complete loss of teeth, unspecified cause, unspecified class: Secondary | ICD-10-CM | POA: Diagnosis not present

## 2012-05-17 DIAGNOSIS — Z79899 Other long term (current) drug therapy: Secondary | ICD-10-CM | POA: Diagnosis not present

## 2012-05-17 DIAGNOSIS — K089 Disorder of teeth and supporting structures, unspecified: Secondary | ICD-10-CM | POA: Diagnosis not present

## 2012-05-19 DIAGNOSIS — I10 Essential (primary) hypertension: Secondary | ICD-10-CM | POA: Diagnosis not present

## 2012-05-19 DIAGNOSIS — K0889 Other specified disorders of teeth and supporting structures: Secondary | ICD-10-CM | POA: Diagnosis not present

## 2012-05-19 DIAGNOSIS — M273 Alveolitis of jaws: Secondary | ICD-10-CM | POA: Diagnosis not present

## 2012-05-24 DIAGNOSIS — K089 Disorder of teeth and supporting structures, unspecified: Secondary | ICD-10-CM | POA: Diagnosis not present

## 2012-06-06 DIAGNOSIS — K089 Disorder of teeth and supporting structures, unspecified: Secondary | ICD-10-CM | POA: Diagnosis not present

## 2012-06-06 DIAGNOSIS — K029 Dental caries, unspecified: Secondary | ICD-10-CM | POA: Diagnosis not present

## 2012-06-06 DIAGNOSIS — R52 Pain, unspecified: Secondary | ICD-10-CM | POA: Diagnosis not present

## 2012-06-12 DIAGNOSIS — N181 Chronic kidney disease, stage 1: Secondary | ICD-10-CM | POA: Diagnosis not present

## 2012-06-12 DIAGNOSIS — I129 Hypertensive chronic kidney disease with stage 1 through stage 4 chronic kidney disease, or unspecified chronic kidney disease: Secondary | ICD-10-CM | POA: Diagnosis not present

## 2012-06-12 DIAGNOSIS — G894 Chronic pain syndrome: Secondary | ICD-10-CM | POA: Diagnosis not present

## 2012-06-12 DIAGNOSIS — G43119 Migraine with aura, intractable, without status migrainosus: Secondary | ICD-10-CM | POA: Diagnosis not present

## 2012-07-09 ENCOUNTER — Emergency Department: Payer: Self-pay | Admitting: Unknown Physician Specialty

## 2012-07-09 DIAGNOSIS — I129 Hypertensive chronic kidney disease with stage 1 through stage 4 chronic kidney disease, or unspecified chronic kidney disease: Secondary | ICD-10-CM | POA: Diagnosis not present

## 2012-07-09 DIAGNOSIS — R6884 Jaw pain: Secondary | ICD-10-CM | POA: Diagnosis not present

## 2012-07-09 DIAGNOSIS — M545 Low back pain, unspecified: Secondary | ICD-10-CM | POA: Diagnosis not present

## 2012-07-09 DIAGNOSIS — F17200 Nicotine dependence, unspecified, uncomplicated: Secondary | ICD-10-CM | POA: Diagnosis not present

## 2012-07-09 DIAGNOSIS — G43909 Migraine, unspecified, not intractable, without status migrainosus: Secondary | ICD-10-CM | POA: Diagnosis not present

## 2012-07-09 DIAGNOSIS — F411 Generalized anxiety disorder: Secondary | ICD-10-CM | POA: Diagnosis not present

## 2012-07-09 DIAGNOSIS — F329 Major depressive disorder, single episode, unspecified: Secondary | ICD-10-CM | POA: Diagnosis not present

## 2012-07-09 DIAGNOSIS — K029 Dental caries, unspecified: Secondary | ICD-10-CM | POA: Diagnosis not present

## 2012-07-09 DIAGNOSIS — N181 Chronic kidney disease, stage 1: Secondary | ICD-10-CM | POA: Diagnosis not present

## 2012-07-09 DIAGNOSIS — F3289 Other specified depressive episodes: Secondary | ICD-10-CM | POA: Diagnosis not present

## 2012-07-19 DIAGNOSIS — R197 Diarrhea, unspecified: Secondary | ICD-10-CM | POA: Diagnosis not present

## 2012-07-19 DIAGNOSIS — R112 Nausea with vomiting, unspecified: Secondary | ICD-10-CM | POA: Diagnosis not present

## 2012-07-19 DIAGNOSIS — K274 Chronic or unspecified peptic ulcer, site unspecified, with hemorrhage: Secondary | ICD-10-CM | POA: Diagnosis not present

## 2012-07-19 DIAGNOSIS — R1013 Epigastric pain: Secondary | ICD-10-CM | POA: Diagnosis not present

## 2012-07-21 DIAGNOSIS — R112 Nausea with vomiting, unspecified: Secondary | ICD-10-CM | POA: Diagnosis not present

## 2012-07-21 DIAGNOSIS — R1013 Epigastric pain: Secondary | ICD-10-CM | POA: Diagnosis not present

## 2012-07-21 DIAGNOSIS — K2990 Gastroduodenitis, unspecified, without bleeding: Secondary | ICD-10-CM | POA: Diagnosis not present

## 2012-07-21 DIAGNOSIS — K2970 Gastritis, unspecified, without bleeding: Secondary | ICD-10-CM | POA: Diagnosis not present

## 2012-08-04 DIAGNOSIS — M545 Low back pain, unspecified: Secondary | ICD-10-CM | POA: Diagnosis not present

## 2012-08-04 DIAGNOSIS — K253 Acute gastric ulcer without hemorrhage or perforation: Secondary | ICD-10-CM | POA: Diagnosis not present

## 2012-08-04 DIAGNOSIS — N181 Chronic kidney disease, stage 1: Secondary | ICD-10-CM | POA: Diagnosis not present

## 2012-08-04 DIAGNOSIS — I129 Hypertensive chronic kidney disease with stage 1 through stage 4 chronic kidney disease, or unspecified chronic kidney disease: Secondary | ICD-10-CM | POA: Diagnosis not present

## 2012-09-03 DIAGNOSIS — I10 Essential (primary) hypertension: Secondary | ICD-10-CM | POA: Diagnosis not present

## 2012-09-03 DIAGNOSIS — M545 Low back pain, unspecified: Secondary | ICD-10-CM | POA: Diagnosis not present

## 2012-09-03 DIAGNOSIS — IMO0002 Reserved for concepts with insufficient information to code with codable children: Secondary | ICD-10-CM | POA: Diagnosis not present

## 2012-09-17 DIAGNOSIS — N181 Chronic kidney disease, stage 1: Secondary | ICD-10-CM | POA: Diagnosis not present

## 2012-09-17 DIAGNOSIS — I129 Hypertensive chronic kidney disease with stage 1 through stage 4 chronic kidney disease, or unspecified chronic kidney disease: Secondary | ICD-10-CM | POA: Diagnosis not present

## 2012-09-17 DIAGNOSIS — G894 Chronic pain syndrome: Secondary | ICD-10-CM | POA: Diagnosis not present

## 2012-09-30 DIAGNOSIS — R112 Nausea with vomiting, unspecified: Secondary | ICD-10-CM | POA: Diagnosis not present

## 2012-09-30 DIAGNOSIS — Z9889 Other specified postprocedural states: Secondary | ICD-10-CM | POA: Diagnosis not present

## 2012-09-30 DIAGNOSIS — Z880 Allergy status to penicillin: Secondary | ICD-10-CM | POA: Diagnosis not present

## 2012-09-30 DIAGNOSIS — R52 Pain, unspecified: Secondary | ICD-10-CM | POA: Diagnosis not present

## 2012-09-30 DIAGNOSIS — R51 Headache: Secondary | ICD-10-CM | POA: Diagnosis not present

## 2012-10-23 DIAGNOSIS — G894 Chronic pain syndrome: Secondary | ICD-10-CM | POA: Diagnosis not present

## 2012-10-23 DIAGNOSIS — N181 Chronic kidney disease, stage 1: Secondary | ICD-10-CM | POA: Diagnosis not present

## 2012-10-23 DIAGNOSIS — F17200 Nicotine dependence, unspecified, uncomplicated: Secondary | ICD-10-CM | POA: Diagnosis not present

## 2012-10-23 DIAGNOSIS — I129 Hypertensive chronic kidney disease with stage 1 through stage 4 chronic kidney disease, or unspecified chronic kidney disease: Secondary | ICD-10-CM | POA: Diagnosis not present

## 2012-11-20 DIAGNOSIS — Z87891 Personal history of nicotine dependence: Secondary | ICD-10-CM | POA: Diagnosis not present

## 2012-11-20 DIAGNOSIS — M545 Low back pain, unspecified: Secondary | ICD-10-CM | POA: Diagnosis not present

## 2012-11-20 DIAGNOSIS — G894 Chronic pain syndrome: Secondary | ICD-10-CM | POA: Diagnosis not present

## 2012-11-20 DIAGNOSIS — M5430 Sciatica, unspecified side: Secondary | ICD-10-CM | POA: Diagnosis not present

## 2012-11-23 DIAGNOSIS — M545 Low back pain, unspecified: Secondary | ICD-10-CM | POA: Diagnosis not present

## 2012-11-23 DIAGNOSIS — N181 Chronic kidney disease, stage 1: Secondary | ICD-10-CM | POA: Diagnosis not present

## 2012-11-23 DIAGNOSIS — F17200 Nicotine dependence, unspecified, uncomplicated: Secondary | ICD-10-CM | POA: Diagnosis not present

## 2012-11-23 DIAGNOSIS — I129 Hypertensive chronic kidney disease with stage 1 through stage 4 chronic kidney disease, or unspecified chronic kidney disease: Secondary | ICD-10-CM | POA: Diagnosis not present

## 2012-12-22 DIAGNOSIS — M545 Low back pain, unspecified: Secondary | ICD-10-CM | POA: Diagnosis not present

## 2012-12-22 DIAGNOSIS — M25519 Pain in unspecified shoulder: Secondary | ICD-10-CM | POA: Diagnosis not present

## 2012-12-22 DIAGNOSIS — N181 Chronic kidney disease, stage 1: Secondary | ICD-10-CM | POA: Diagnosis not present

## 2012-12-22 DIAGNOSIS — I129 Hypertensive chronic kidney disease with stage 1 through stage 4 chronic kidney disease, or unspecified chronic kidney disease: Secondary | ICD-10-CM | POA: Diagnosis not present

## 2012-12-22 DIAGNOSIS — Z79899 Other long term (current) drug therapy: Secondary | ICD-10-CM | POA: Diagnosis not present

## 2012-12-22 DIAGNOSIS — Z23 Encounter for immunization: Secondary | ICD-10-CM | POA: Diagnosis not present

## 2012-12-22 DIAGNOSIS — Z1331 Encounter for screening for depression: Secondary | ICD-10-CM | POA: Diagnosis not present

## 2012-12-29 ENCOUNTER — Ambulatory Visit: Payer: Self-pay | Admitting: Family

## 2012-12-29 DIAGNOSIS — M25519 Pain in unspecified shoulder: Secondary | ICD-10-CM | POA: Diagnosis not present

## 2012-12-29 DIAGNOSIS — M899 Disorder of bone, unspecified: Secondary | ICD-10-CM | POA: Diagnosis not present

## 2012-12-29 DIAGNOSIS — M545 Low back pain, unspecified: Secondary | ICD-10-CM | POA: Diagnosis not present

## 2012-12-29 DIAGNOSIS — M542 Cervicalgia: Secondary | ICD-10-CM | POA: Diagnosis not present

## 2013-01-27 ENCOUNTER — Ambulatory Visit: Payer: Self-pay | Admitting: Family

## 2013-02-19 DIAGNOSIS — I129 Hypertensive chronic kidney disease with stage 1 through stage 4 chronic kidney disease, or unspecified chronic kidney disease: Secondary | ICD-10-CM | POA: Diagnosis not present

## 2013-02-19 DIAGNOSIS — M545 Low back pain, unspecified: Secondary | ICD-10-CM | POA: Diagnosis not present

## 2013-02-19 DIAGNOSIS — G894 Chronic pain syndrome: Secondary | ICD-10-CM | POA: Diagnosis not present

## 2013-02-19 DIAGNOSIS — N181 Chronic kidney disease, stage 1: Secondary | ICD-10-CM | POA: Diagnosis not present

## 2013-03-04 DIAGNOSIS — Z1331 Encounter for screening for depression: Secondary | ICD-10-CM | POA: Diagnosis not present

## 2013-03-04 DIAGNOSIS — M545 Low back pain, unspecified: Secondary | ICD-10-CM | POA: Diagnosis not present

## 2013-03-04 DIAGNOSIS — R6889 Other general symptoms and signs: Secondary | ICD-10-CM | POA: Diagnosis not present

## 2013-03-04 DIAGNOSIS — G894 Chronic pain syndrome: Secondary | ICD-10-CM | POA: Diagnosis not present

## 2013-03-04 DIAGNOSIS — F17200 Nicotine dependence, unspecified, uncomplicated: Secondary | ICD-10-CM | POA: Diagnosis not present

## 2013-03-10 ENCOUNTER — Emergency Department: Payer: Self-pay | Admitting: Internal Medicine

## 2013-03-11 ENCOUNTER — Emergency Department: Payer: Self-pay | Admitting: Emergency Medicine

## 2013-03-17 DIAGNOSIS — N181 Chronic kidney disease, stage 1: Secondary | ICD-10-CM | POA: Diagnosis not present

## 2013-03-17 DIAGNOSIS — M545 Low back pain, unspecified: Secondary | ICD-10-CM | POA: Diagnosis not present

## 2013-03-17 DIAGNOSIS — I129 Hypertensive chronic kidney disease with stage 1 through stage 4 chronic kidney disease, or unspecified chronic kidney disease: Secondary | ICD-10-CM | POA: Diagnosis not present

## 2013-03-17 DIAGNOSIS — G43909 Migraine, unspecified, not intractable, without status migrainosus: Secondary | ICD-10-CM | POA: Diagnosis not present

## 2013-05-17 DIAGNOSIS — M545 Low back pain, unspecified: Secondary | ICD-10-CM | POA: Diagnosis not present

## 2013-05-17 DIAGNOSIS — Z87891 Personal history of nicotine dependence: Secondary | ICD-10-CM | POA: Diagnosis not present

## 2013-05-17 DIAGNOSIS — G43119 Migraine with aura, intractable, without status migrainosus: Secondary | ICD-10-CM | POA: Diagnosis not present

## 2013-05-17 DIAGNOSIS — G894 Chronic pain syndrome: Secondary | ICD-10-CM | POA: Diagnosis not present

## 2013-05-24 DIAGNOSIS — M533 Sacrococcygeal disorders, not elsewhere classified: Secondary | ICD-10-CM | POA: Diagnosis not present

## 2013-05-24 DIAGNOSIS — R42 Dizziness and giddiness: Secondary | ICD-10-CM | POA: Diagnosis not present

## 2013-05-24 DIAGNOSIS — Z79899 Other long term (current) drug therapy: Secondary | ICD-10-CM | POA: Diagnosis not present

## 2013-05-24 DIAGNOSIS — G9050 Complex regional pain syndrome I, unspecified: Secondary | ICD-10-CM | POA: Diagnosis not present

## 2013-05-24 DIAGNOSIS — G609 Hereditary and idiopathic neuropathy, unspecified: Secondary | ICD-10-CM | POA: Diagnosis not present

## 2013-05-24 DIAGNOSIS — G9009 Other idiopathic peripheral autonomic neuropathy: Secondary | ICD-10-CM | POA: Diagnosis not present

## 2013-05-24 DIAGNOSIS — G603 Idiopathic progressive neuropathy: Secondary | ICD-10-CM | POA: Diagnosis not present

## 2013-05-24 DIAGNOSIS — H81399 Other peripheral vertigo, unspecified ear: Secondary | ICD-10-CM | POA: Diagnosis not present

## 2013-05-24 DIAGNOSIS — H8190 Unspecified disorder of vestibular function, unspecified ear: Secondary | ICD-10-CM | POA: Diagnosis not present

## 2013-05-24 DIAGNOSIS — M545 Low back pain, unspecified: Secondary | ICD-10-CM | POA: Diagnosis not present

## 2013-05-24 DIAGNOSIS — G541 Lumbosacral plexus disorders: Secondary | ICD-10-CM | POA: Diagnosis not present

## 2013-05-24 DIAGNOSIS — M542 Cervicalgia: Secondary | ICD-10-CM | POA: Diagnosis not present

## 2013-05-24 DIAGNOSIS — M5030 Other cervical disc degeneration, unspecified cervical region: Secondary | ICD-10-CM | POA: Diagnosis not present

## 2013-06-07 DIAGNOSIS — Z79899 Other long term (current) drug therapy: Secondary | ICD-10-CM | POA: Diagnosis not present

## 2013-06-07 DIAGNOSIS — M545 Low back pain, unspecified: Secondary | ICD-10-CM | POA: Diagnosis not present

## 2013-06-07 DIAGNOSIS — M5030 Other cervical disc degeneration, unspecified cervical region: Secondary | ICD-10-CM | POA: Diagnosis not present

## 2013-06-07 DIAGNOSIS — M533 Sacrococcygeal disorders, not elsewhere classified: Secondary | ICD-10-CM | POA: Diagnosis not present

## 2013-06-07 DIAGNOSIS — M542 Cervicalgia: Secondary | ICD-10-CM | POA: Diagnosis not present

## 2013-07-05 DIAGNOSIS — M533 Sacrococcygeal disorders, not elsewhere classified: Secondary | ICD-10-CM | POA: Diagnosis not present

## 2013-07-05 DIAGNOSIS — Z79899 Other long term (current) drug therapy: Secondary | ICD-10-CM | POA: Diagnosis not present

## 2013-07-05 DIAGNOSIS — M545 Low back pain, unspecified: Secondary | ICD-10-CM | POA: Diagnosis not present

## 2013-07-05 DIAGNOSIS — M542 Cervicalgia: Secondary | ICD-10-CM | POA: Diagnosis not present

## 2013-07-05 DIAGNOSIS — M5030 Other cervical disc degeneration, unspecified cervical region: Secondary | ICD-10-CM | POA: Diagnosis not present

## 2013-07-28 ENCOUNTER — Emergency Department: Payer: Self-pay | Admitting: Emergency Medicine

## 2013-07-28 LAB — BASIC METABOLIC PANEL
ANION GAP: 9 (ref 7–16)
BUN: 6 mg/dL — ABNORMAL LOW (ref 7–18)
CHLORIDE: 104 mmol/L (ref 98–107)
Calcium, Total: 8.9 mg/dL (ref 8.5–10.1)
Co2: 19 mmol/L — ABNORMAL LOW (ref 21–32)
Creatinine: 0.91 mg/dL (ref 0.60–1.30)
EGFR (African American): >60
GLUCOSE: 87 mg/dL (ref 65–99)
OSMOLALITY: 261 (ref 275–301)
Potassium: 3.6 mmol/L (ref 3.5–5.1)
Sodium: 132 mmol/L — ABNORMAL LOW (ref 136–145)

## 2013-07-28 LAB — CBC
HCT: 42.8 % (ref 40.0–52.0)
HGB: 14.9 g/dL (ref 13.0–18.0)
MCH: 30.9 pg (ref 26.0–34.0)
MCHC: 34.7 g/dL (ref 32.0–36.0)
MCV: 89 fL (ref 80–100)
Platelet: 279 10*3/uL (ref 150–440)
RBC: 4.8 10*6/uL (ref 4.40–5.90)
RDW: 14 % (ref 11.5–14.5)
WBC: 7.9 10*3/uL (ref 3.8–10.6)

## 2013-07-29 ENCOUNTER — Emergency Department: Payer: Self-pay | Admitting: Emergency Medicine

## 2013-07-29 LAB — BASIC METABOLIC PANEL
Anion Gap: 9 (ref 7–16)
BUN: 10 mg/dL (ref 7–18)
CALCIUM: 9.2 mg/dL (ref 8.5–10.1)
CREATININE: 0.79 mg/dL (ref 0.60–1.30)
Chloride: 101 mmol/L (ref 98–107)
Co2: 21 mmol/L (ref 21–32)
EGFR (Non-African Amer.): >60
Glucose: 93 mg/dL (ref 65–99)
Osmolality: 261 (ref 275–301)
Potassium: 3.7 mmol/L (ref 3.5–5.1)
Sodium: 131 mmol/L — ABNORMAL LOW (ref 136–145)

## 2013-07-29 LAB — CBC WITH DIFFERENTIAL/PLATELET
Basophil #: 0.1 10*3/uL (ref 0.0–0.1)
Basophil %: 0.3 %
EOS ABS: 0 10*3/uL (ref 0.0–0.7)
Eosinophil %: 0.1 %
HCT: 41.8 % (ref 40.0–52.0)
HGB: 14.2 g/dL (ref 13.0–18.0)
LYMPHS ABS: 1.5 10*3/uL (ref 1.0–3.6)
LYMPHS PCT: 7.7 %
MCH: 30.3 pg (ref 26.0–34.0)
MCHC: 34 g/dL (ref 32.0–36.0)
MCV: 89 fL (ref 80–100)
Monocyte #: 1.3 x10 3/mm — ABNORMAL HIGH (ref 0.2–1.0)
Monocyte %: 6.5 %
NEUTROS ABS: 16.9 10*3/uL — AB (ref 1.4–6.5)
NEUTROS PCT: 85.4 %
Platelet: 314 10*3/uL (ref 150–440)
RBC: 4.68 10*6/uL (ref 4.40–5.90)
RDW: 14 % (ref 11.5–14.5)
WBC: 19.7 10*3/uL — AB (ref 3.8–10.6)

## 2013-07-29 LAB — CSF CELL COUNT WITH DIFFERENTIAL
CSF TUBE #: 3
Eosinophil: 0 %
Lymphocytes: 42 %
Monocytes/Macrophages: 16 %
NEUTROS PCT: 42 %
Other Cells: 0 %
RBC (CSF): 1276 /mm3
WBC (CSF): 23 /mm3

## 2013-07-29 LAB — CSF CELL CT + PROT + GLU PANEL
CSF TUBE #: 1
Eosinophil: 0 %
Glucose, CSF: 59 mg/dL (ref 40–75)
Lymphocytes: 13 %
MONOCYTES/MACROPHAGES: 4 %
NEUTROS PCT: 83 %
Other Cells: 0 %
Protein, CSF: 39 mg/dL (ref 15–45)
RBC (CSF): 20575 /mm3
WBC (CSF): 86 /mm3

## 2013-08-02 DIAGNOSIS — M545 Low back pain, unspecified: Secondary | ICD-10-CM | POA: Diagnosis not present

## 2013-08-02 DIAGNOSIS — S139XXA Sprain of joints and ligaments of unspecified parts of neck, initial encounter: Secondary | ICD-10-CM | POA: Diagnosis not present

## 2013-08-02 DIAGNOSIS — M542 Cervicalgia: Secondary | ICD-10-CM | POA: Diagnosis not present

## 2013-08-02 DIAGNOSIS — M5030 Other cervical disc degeneration, unspecified cervical region: Secondary | ICD-10-CM | POA: Diagnosis not present

## 2013-08-03 LAB — CSF CULTURE

## 2013-10-20 DIAGNOSIS — F322 Major depressive disorder, single episode, severe without psychotic features: Secondary | ICD-10-CM | POA: Diagnosis not present

## 2013-10-26 ENCOUNTER — Emergency Department: Payer: Self-pay | Admitting: Emergency Medicine

## 2013-11-15 DIAGNOSIS — M25519 Pain in unspecified shoulder: Secondary | ICD-10-CM | POA: Diagnosis not present

## 2013-11-15 DIAGNOSIS — G43809 Other migraine, not intractable, without status migrainosus: Secondary | ICD-10-CM | POA: Diagnosis not present

## 2013-11-24 ENCOUNTER — Emergency Department: Payer: Self-pay | Admitting: Emergency Medicine

## 2013-11-25 ENCOUNTER — Emergency Department: Payer: Self-pay | Admitting: Emergency Medicine

## 2013-11-25 DIAGNOSIS — R4789 Other speech disturbances: Secondary | ICD-10-CM | POA: Diagnosis not present

## 2013-11-25 DIAGNOSIS — R079 Chest pain, unspecified: Secondary | ICD-10-CM | POA: Diagnosis not present

## 2013-11-25 DIAGNOSIS — R42 Dizziness and giddiness: Secondary | ICD-10-CM | POA: Diagnosis not present

## 2013-11-25 DIAGNOSIS — M6281 Muscle weakness (generalized): Secondary | ICD-10-CM | POA: Diagnosis not present

## 2013-12-23 DIAGNOSIS — I159 Secondary hypertension, unspecified: Secondary | ICD-10-CM | POA: Diagnosis not present

## 2013-12-23 DIAGNOSIS — F064 Anxiety disorder due to known physiological condition: Secondary | ICD-10-CM | POA: Diagnosis not present

## 2013-12-23 DIAGNOSIS — I10 Essential (primary) hypertension: Secondary | ICD-10-CM | POA: Diagnosis not present

## 2013-12-23 DIAGNOSIS — Z87891 Personal history of nicotine dependence: Secondary | ICD-10-CM | POA: Diagnosis not present

## 2014-01-03 LAB — URINALYSIS, COMPLETE
BACTERIA: NONE SEEN
BILIRUBIN, UR: NEGATIVE
Blood: NEGATIVE
GLUCOSE, UR: NEGATIVE mg/dL (ref 0–75)
Ketone: NEGATIVE
LEUKOCYTE ESTERASE: NEGATIVE
NITRITE: NEGATIVE
PROTEIN: NEGATIVE
Ph: 5 (ref 4.5–8.0)
SPECIFIC GRAVITY: 1.005 (ref 1.003–1.030)
SQUAMOUS EPITHELIAL: NONE SEEN
WBC UR: 2 /HPF (ref 0–5)

## 2014-01-03 LAB — COMPREHENSIVE METABOLIC PANEL
ALBUMIN: 3.8 g/dL (ref 3.4–5.0)
ALT: 28 U/L
AST: 23 U/L (ref 15–37)
Alkaline Phosphatase: 102 U/L
Anion Gap: 8 (ref 7–16)
BILIRUBIN TOTAL: 0.3 mg/dL (ref 0.2–1.0)
BUN: 8 mg/dL (ref 7–18)
CALCIUM: 8.5 mg/dL (ref 8.5–10.1)
CHLORIDE: 104 mmol/L (ref 98–107)
CREATININE: 1.18 mg/dL (ref 0.60–1.30)
Co2: 27 mmol/L (ref 21–32)
EGFR (African American): >60
EGFR (Non-African Amer.): >60
Glucose: 73 mg/dL (ref 65–99)
Osmolality: 274 (ref 275–301)
Potassium: 3.9 mmol/L (ref 3.5–5.1)
Sodium: 139 mmol/L (ref 136–145)
TOTAL PROTEIN: 7.7 g/dL (ref 6.4–8.2)

## 2014-01-03 LAB — DRUG SCREEN, URINE
Amphetamines, Ur Screen: NEGATIVE (ref ?–1000)
Barbiturates, Ur Screen: NEGATIVE (ref ?–200)
Benzodiazepine, Ur Scrn: POSITIVE (ref ?–200)
CANNABINOID 50 NG, UR ~~LOC~~: NEGATIVE (ref ?–50)
COCAINE METABOLITE, UR ~~LOC~~: POSITIVE (ref ?–300)
MDMA (Ecstasy)Ur Screen: NEGATIVE (ref ?–500)
Methadone, Ur Screen: NEGATIVE (ref ?–300)
Opiate, Ur Screen: POSITIVE (ref ?–300)
PHENCYCLIDINE (PCP) UR S: NEGATIVE (ref ?–25)
Tricyclic, Ur Screen: NEGATIVE (ref ?–1000)

## 2014-01-03 LAB — CBC
HCT: 43.9 % (ref 40.0–52.0)
HGB: 14.5 g/dL (ref 13.0–18.0)
MCH: 30.5 pg (ref 26.0–34.0)
MCHC: 33.1 g/dL (ref 32.0–36.0)
MCV: 92 fL (ref 80–100)
PLATELETS: 296 10*3/uL (ref 150–440)
RBC: 4.75 10*6/uL (ref 4.40–5.90)
RDW: 14.9 % — ABNORMAL HIGH (ref 11.5–14.5)
WBC: 8 10*3/uL (ref 3.8–10.6)

## 2014-01-03 LAB — SALICYLATE LEVEL: SALICYLATES, SERUM: 3.2 mg/dL — AB

## 2014-01-03 LAB — ACETAMINOPHEN LEVEL

## 2014-01-03 LAB — ETHANOL

## 2014-01-04 ENCOUNTER — Inpatient Hospital Stay: Payer: Self-pay | Admitting: Psychiatry

## 2014-01-11 ENCOUNTER — Emergency Department: Payer: Self-pay | Admitting: Emergency Medicine

## 2014-01-11 LAB — CBC
HCT: 48 % (ref 40.0–52.0)
HCT: 44 % (ref 40.0–52.0)
HGB: 15.5 g/dL (ref 13.0–18.0)
HGB: 14.8 g/dL (ref 13.0–18.0)
MCH: 29.9 pg (ref 26.0–34.0)
MCH: 30.4 pg (ref 26.0–34.0)
MCHC: 32.3 g/dL (ref 32.0–36.0)
MCHC: 33.7 g/dL (ref 32.0–36.0)
MCV: 93 fL (ref 80–100)
MCV: 90 fL (ref 80–100)
PLATELETS: 242 10*3/uL (ref 150–440)
Platelet: 272 10*3/uL (ref 150–440)
RBC: 5.19 10*6/uL (ref 4.40–5.90)
RBC: 4.88 10*6/uL (ref 4.40–5.90)
RDW: 14.2 % (ref 11.5–14.5)
RDW: 14.4 % (ref 11.5–14.5)
WBC: 11.7 10*3/uL — ABNORMAL HIGH (ref 3.8–10.6)
WBC: 12.3 10*3/uL — AB (ref 3.8–10.6)

## 2014-01-11 LAB — COMPREHENSIVE METABOLIC PANEL
ALK PHOS: 113 U/L
ALT: 31 U/L
AST: 24 U/L (ref 15–37)
AST: 27 U/L (ref 15–37)
Albumin: 4.1 g/dL (ref 3.4–5.0)
Albumin: 3.9 g/dL (ref 3.4–5.0)
Alkaline Phosphatase: 124 U/L — ABNORMAL HIGH
Anion Gap: 8 (ref 7–16)
Anion Gap: 12 (ref 7–16)
BILIRUBIN TOTAL: 0.5 mg/dL (ref 0.2–1.0)
BUN: 14 mg/dL (ref 7–18)
BUN: 12 mg/dL (ref 7–18)
Bilirubin,Total: 0.5 mg/dL (ref 0.2–1.0)
CALCIUM: 8.5 mg/dL (ref 8.5–10.1)
CREATININE: 1.31 mg/dL — AB (ref 0.60–1.30)
CREATININE: 1.06 mg/dL (ref 0.60–1.30)
Calcium, Total: 9.1 mg/dL (ref 8.5–10.1)
Chloride: 96 mmol/L — ABNORMAL LOW (ref 98–107)
Chloride: 93 mmol/L — ABNORMAL LOW (ref 98–107)
Co2: 28 mmol/L (ref 21–32)
Co2: 25 mmol/L (ref 21–32)
EGFR (African American): >60
EGFR (African American): >60
EGFR (Non-African Amer.): >60
GLUCOSE: 116 mg/dL — AB (ref 65–99)
Glucose: 117 mg/dL — ABNORMAL HIGH (ref 65–99)
OSMOLALITY: 266 (ref 275–301)
Osmolality: 262 (ref 275–301)
Potassium: 4.5 mmol/L (ref 3.5–5.1)
Potassium: 3.5 mmol/L (ref 3.5–5.1)
SGPT (ALT): 34 U/L
Sodium: 132 mmol/L — ABNORMAL LOW (ref 136–145)
Sodium: 130 mmol/L — ABNORMAL LOW (ref 136–145)
Total Protein: 8.2 g/dL (ref 6.4–8.2)
Total Protein: 7.7 g/dL (ref 6.4–8.2)

## 2014-01-11 LAB — SALICYLATE LEVEL
SALICYLATES, SERUM: 2.1 mg/dL
SALICYLATES, SERUM: 2.2 mg/dL

## 2014-01-11 LAB — DRUG SCREEN, URINE
Amphetamines, Ur Screen: NEGATIVE (ref ?–1000)
BARBITURATES, UR SCREEN: NEGATIVE (ref ?–200)
BENZODIAZEPINE, UR SCRN: POSITIVE (ref ?–200)
COCAINE METABOLITE, UR ~~LOC~~: NEGATIVE (ref ?–300)
Cannabinoid 50 Ng, Ur ~~LOC~~: NEGATIVE (ref ?–50)
MDMA (Ecstasy)Ur Screen: NEGATIVE (ref ?–500)
Methadone, Ur Screen: NEGATIVE (ref ?–300)
Opiate, Ur Screen: POSITIVE (ref ?–300)
Phencyclidine (PCP) Ur S: NEGATIVE (ref ?–25)
Tricyclic, Ur Screen: NEGATIVE (ref ?–1000)

## 2014-01-11 LAB — ETHANOL
Ethanol: <3 mg/dL
Ethanol: <3 mg/dL

## 2014-01-11 LAB — URINALYSIS, COMPLETE
BILIRUBIN, UR: NEGATIVE
BLOOD: NEGATIVE
Bacteria: NONE SEEN
GLUCOSE, UR: NEGATIVE mg/dL (ref 0–75)
Ketone: NEGATIVE
Leukocyte Esterase: NEGATIVE
NITRITE: NEGATIVE
PH: 7 (ref 4.5–8.0)
Protein: NEGATIVE
RBC,UR: 1 /HPF (ref 0–5)
Specific Gravity: 1.016 (ref 1.003–1.030)
Squamous Epithelial: NONE SEEN

## 2014-01-11 LAB — TSH: THYROID STIMULATING HORM: 0.599 u[IU]/mL

## 2014-01-11 LAB — ACETAMINOPHEN LEVEL
Acetaminophen: <2 ug/mL
Acetaminophen: <2 ug/mL

## 2014-01-12 LAB — DRUG SCREEN, URINE
Amphetamines, Ur Screen: NEGATIVE (ref ?–1000)
BARBITURATES, UR SCREEN: NEGATIVE (ref ?–200)
BENZODIAZEPINE, UR SCRN: POSITIVE (ref ?–200)
COCAINE METABOLITE, UR ~~LOC~~: NEGATIVE (ref ?–300)
Cannabinoid 50 Ng, Ur ~~LOC~~: NEGATIVE (ref ?–50)
MDMA (ECSTASY) UR SCREEN: NEGATIVE (ref ?–500)
Methadone, Ur Screen: NEGATIVE (ref ?–300)
OPIATE, UR SCREEN: POSITIVE (ref ?–300)
Phencyclidine (PCP) Ur S: POSITIVE (ref ?–25)
TRICYCLIC, UR SCREEN: NEGATIVE (ref ?–1000)

## 2014-01-12 LAB — URINALYSIS, COMPLETE
BLOOD: NEGATIVE
Bacteria: NONE SEEN
Bilirubin,UR: NEGATIVE
Glucose,UR: NEGATIVE mg/dL (ref 0–75)
KETONE: NEGATIVE
Leukocyte Esterase: NEGATIVE
Nitrite: NEGATIVE
PROTEIN: NEGATIVE
Ph: 7 (ref 4.5–8.0)
RBC,UR: NONE SEEN /HPF (ref 0–5)
Specific Gravity: 1.015 (ref 1.003–1.030)

## 2014-01-13 ENCOUNTER — Inpatient Hospital Stay: Payer: Self-pay | Admitting: Psychiatry

## 2014-01-16 DIAGNOSIS — F322 Major depressive disorder, single episode, severe without psychotic features: Secondary | ICD-10-CM | POA: Diagnosis not present

## 2014-05-24 DIAGNOSIS — H6593 Unspecified nonsuppurative otitis media, bilateral: Secondary | ICD-10-CM | POA: Diagnosis not present

## 2014-05-24 DIAGNOSIS — I10 Essential (primary) hypertension: Secondary | ICD-10-CM | POA: Diagnosis not present

## 2014-06-07 DIAGNOSIS — I10 Essential (primary) hypertension: Secondary | ICD-10-CM | POA: Diagnosis not present

## 2014-07-08 DIAGNOSIS — Z87891 Personal history of nicotine dependence: Secondary | ICD-10-CM | POA: Diagnosis not present

## 2014-07-08 DIAGNOSIS — I10 Essential (primary) hypertension: Secondary | ICD-10-CM | POA: Diagnosis not present

## 2014-07-08 DIAGNOSIS — G43119 Migraine with aura, intractable, without status migrainosus: Secondary | ICD-10-CM | POA: Diagnosis not present

## 2014-07-08 DIAGNOSIS — J302 Other seasonal allergic rhinitis: Secondary | ICD-10-CM | POA: Diagnosis not present

## 2014-07-09 NOTE — Consult Note (Signed)
PATIENT NAME:  [name], [name] MR#:  [id number] DATE OF BIRTH:  1965/06/11  DATE OF CONSULTATION:  01/12/2014  REFERRING PHYSICIAN:   CONSULTING PHYSICIAN:  Gonzella Lex, MD  IDENTIFYING INFORMATION AND REASON FOR CONSULTATION: A 49 year old man with a history of substance abuse and depression who came into the hospital after taking an overdose of Phenergan.   CHIEF COMPLAINT: "I was just tired."   HISTORY OF PRESENT ILLNESS: Information obtained from the patient and the chart. The patient was just discharged from the hospital a couple days ago. He came back to the hospital having overdosed on his Phenergan. He tells me that he did it because he is tired of living like this and was hoping that he would die and still wants to die. Mood is depressed. He says he still hears voices at times. Not using any alcohol or drugs. He says that he did not get any of his new medicines filled that Dr. Jerilee Hoh prescribed for him because he could not afford them.   PAST PSYCHIATRIC HISTORY: Long history of substance abuse, especially opiates, as well as mood instability. The patient's story seems to change very frequently from one interaction to another. It has a history of suicide attempt by overdose and apparently took an overdose of Phenergan this time, about 40 pills.   SOCIAL HISTORY: Lives with a roommate. Thinks he is going to be thrown out of there. Does not stay in close contact with his family.   FAMILY HISTORY: Denies any family history.   PAST MEDICAL HISTORY: Chronic head pain. It is not clear exactly how bad that is. Last time I admitted him a week ago he was emphasizing the severity of his intense chronic pain. He did not even mention it to me today. Also, high blood pressure and gastric reflux.   MEDICATIONS: Lisinopril 20 mg a day, omeprazole 40 mg a day, was taking OxyContin 15 mg up to 5 times a day, has not been on that since he came back in here, trazodone 150 mg at night, gabapentin  400 mg 3 times a day. The medicines that Dr. Jerilee Hoh started him on were sertraline, Topamax and I am not sure what else, possibly the gabapentin.   ALLERGIES: CONTRAST DYE, HYDROCODONE AND PENICILLIN.   REVIEW OF SYSTEMS: Says he feels depressed, wants to die, has auditory hallucinations. Chronic head pain, but he does not make much of it right now. Physical symptoms otherwise all negative.   MENTAL STATUS EXAMINATION: Disheveled gentleman who looks older than his stated age. Cooperative with the interview. Eye contact good. Psychomotor activity slow. Speech slow and decreased in amount and quiet. Affect flat. Mood stated as depressed. Thoughts slow but lucid. No evidence of delusions. Denies visual hallucinations, but says he still hears voices at times. Endorses suicidal ideation with thoughts to bang his head on something or overdose. No homicidal ideation. Can remember 3/3 objects immediately, 1/3 at 3 minutes. Alert and oriented x 4. Judgment and insight poor.   LABORATORY RESULTS: Urinalysis today unremarkable. Drug screen is positive for benzodiazepines, phencyclidine, and opiates. TSH normal. CBC: Slightly elevated white count 12.3. Chemistry panel: Elevated glucose, low sodium at 130.   VITAL SIGNS: Blood pressure 134/70, respirations 18, pulse 94, temperature 97.8.   ASSESSMENT: A 49 year old man with a history of substance abuse and depression. Says he overdosed on Phenergan. Continues to endorse suicidal ideation. Seems to be doing fine currently without any of his narcotic pain medicine. He is persistent in stating  he still has suicidal thoughts, and he apparently did overdose on Phenergan. Needs admission to the hospital for suicidality.   TREATMENT PLAN: Admit to psychiatry. Continue the basic medicines of his lisinopril, the trazodone at night to help sleep, the gabapentin which may be helpful for the pain, the pantoprazole, but no narcotic pain medicines. Also, will not restart  sertraline or Topamax. Close and suicide precautions in place.   DIAGNOSIS, PRINCIPAL AND PRIMARY:  AXIS I: Major depression.   SECONDARY DIAGNOSES: AXIS I: Opiate abuse, moderate, chronic pain, gastrointestinal reflux, high blood pressure.     ____________________________ Gonzella Lex, MD jtc:at D: 01/12/2014 18:25:38 ET T: 01/12/2014 18:35:59 ET JOB#: [id number]  cc: Gonzella Lex, MD, <Dictator> Gonzella Lex MD ELECTRONICALLY SIGNED 01/22/2014 14:55
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
## 2014-07-09 NOTE — Consult Note (Signed)
PATIENT NAME:  [name], [name] MR#:  [id number] DATE OF BIRTH:  1966/02/18  DATE OF CONSULTATION:  01/04/2014  REFERRING PHYSICIAN:  Emergency Room  CONSULTING PHYSICIAN:  Gonzella Lex, MD  IDENTIFYING INFORMATION AND REASON FOR CONSULTATION: A 49 year old man came in voluntarily to the Emergency Room threatening to kill himself if he did not get pain medicine.   CHIEF COMPLAINT: "I just said that I would kill myself if I did not get some relief from this pain."   HISTORY OF PRESENT ILLNESS: Information obtained from the patient and the chart. The patient says that his pain, which is chronic, has been worse recently. He has been to a neurosurgeon at [facility] and claims that he is on the scheduled to have a neurosurgery procedure done some time in November. The timing of this would suggest that it may not be much of an emergency. In any case, he reports that he his chronic headaches have been worse. He has headaches by his estimate 90% of the time. He also says that he sees purple dots floating in front of his eyes. The degree of the pain has been so bad that he has been thinking that he will kill himself. He has made statements about shooting himself or overdosing in order to get relief. His mood stays irritable much of the time. His sleep pattern is poor. Appetite is poor. He denies any auditory hallucinations. The visual spots he claims are associated with the headaches.   His current medicine he says is: Oxycodone 15 mg 5 times a day, plus diazepam, plus lisinopril and omeprazole. He says pain is probably from the shunt in his head, which has been there since childhood. Later on, he has had further head injuries. No other new reported stress. He also has symptoms of "losing time", having stretches of time during which things happened that he cannot remember them.   PAST PSYCHIATRIC HISTORY: The patient has had past admissions to our hospital with a diagnoses of: Polysubstance dependence. History of  abuse of cocaine, marijuana and prescription narcotics largely. History of behavior problems, manipulation and antisocial behavior, all around his pain medication; typically has poor insight about it. He does have a history of suicide attempts in the past by overdose. He has been to [facility] in the past, but is not currently going there and not getting any treatment. He does not remember any medications that he has been prescribed before.   SOCIAL HISTORY: He lives with a roommate. He has some family in the area. It sounds like he does not stay in very close contact with them. He gets disability, although he says he has been doing some side work from time to time recently.   FAMILY HISTORY: He denies any family history of mental illness or substance abuse problems.   PAST MEDICAL HISTORY: Chronic head pain, which he relates to his shunt from childhood and injuries that he had later, although it appears by documentation those were several years ago. He has a mild high blood pressure and gastric reflux symptoms.   CURRENT MEDICATIONS: I did check on the database, and it appears he had his primary care doctor recently prescribing his oxycodone. He has gotten some Valium occasionally in the doses that he quotes. Lisinopril 2.5 mg a day. Omeprazole 40 mg per day.   ALLERGIES: HYDROCODONE, INTERESTINGLY, IS REPORTED AS WELL AS PENICILLIN.   REVIEW OF SYSTEMS: Complains of severe head pain. He says he sees spots in front of his eyes.  Also, neck pain. Depressed mood. Irritability. Positive suicidal thoughts. No homicidal ideation. The rest of the whole review of systems is negative.   MENTAL STATUS EXAMINATION: Neatly groomed gentleman who appears in absolutely no distress whatsoever. He was able to sit in a room with all the bright lights on, without showing any distress to him. He was eating throughout our interview. He did not look uncomfortable in the least. Eye contact was good. Psychomotor activity was  normal. Speech normal rate, tone and volume. Affect irritable. Mood stated as being upset. Thoughts lucid. No sign of loosening of associations or delusions. Denies auditory hallucinations. Positive visual hallucinations which are related to is alleged headaches. He is alert and oriented x 4, although he did not know the month. He could remember 3 out of 3 objects immediately, 0 out of 3 at 3 minutes; questionable effort. Intelligence average. Judgment and insight seem to be somewhat impaired.   LABORATORY RESULTS: Salicylates slightly elevated, but not toxic. Acetaminophen normal. Alcohol level negative. Chemistry panel all normal. CBC normal. Urinalysis unremarkable. Drug screen positive for cocaine, opiates and benzodiazepines.   PHYSICAL EXAMINATION:  VITAL SIGNS: Current blood pressure 123/69, respirations 18, pulse 98, temperature 98.1.  GENERAL: As I mentioned, he appears to be in no distress at all. He does have a bump on the left side of his head, consistent with where his shunt is located.   ASSESSMENT: This is a 49 year old male with a history of drug dependence, who comes into the hospital with the same presentation he has done in the past, of claiming to have severe pain and requesting narcotic pain medicines. He has a history of addiction with narcotics and abuse of multiple other drugs. Right now he does not look or behave in a way that would suggest any kind of acute pain. He is, however, very clearly and repeatedly threatening suicide, justifying admission for safety.   TREATMENT PLAN: Admit to psychiatry for safety. Suicide, elopement, seizure, and fall precautions all in place. Omeprazole and lisinopril will be continued. P.r.n. Phenergan. Primary treatment team can discuss further treatment plan with him. It might be worthwhile to try at least a day of medication to help him withdraw from the narcotics.   DIAGNOSIS, PRINCIPAL AND PRIMARY:  AXIS I: Depression, related to substance  abuse (opiates).   SECONDARY DIAGNOSES:  AXIS I: Opiate dependence. Cocaine abuse. Benzodiazepine abuse.  AXIS II: Antisocial features.  AXIS III: Chronic pain.   ____________________________ Gonzella Lex, MD jtc:MT D: 01/04/2014 12:26:45 ET T: 01/04/2014 13:01:45 ET JOB#: [id number]  cc: Gonzella Lex, MD, <Dictator> Gonzella Lex MD ELECTRONICALLY SIGNED 01/05/2014 16:16
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
## 2014-07-26 NOTE — H&P (Signed)
PATIENT NAMEKASHTYN, [name] [id number] OF BIRTH:  Jul 23, 1965 OF ASSESSMENT:  01/13/2014   IDENTIFYING INFORMATION: A 49 year old man came in to the Emergency Room after an overdose on Phenergan.  COMPLAINT: "the pain clinic won?t see me until the 3rd."  OF PRESENT ILLNESS: Patient was just discharge from our unit on 10/26.  Pt returned to the ED on 10/27 requesting a prescription for his pain medications as he needed meds to bridge him until his f/u with pain clinic.  Pt was d/c home w/o prescriptions (as he is under pain contract).  He then returned to the ER again after overdosing on a "hand full" of Phenergan.  Patient says he left the unit feeling much better but when he tried to contact the pain clinic for refills they told him he couldn?t be seen before Nov 3rd.  He then started feeling as a burden to people and then decided to overdose.  Today pt c/o depressed mood, and suicidal ideation.  Reports his sleep, appetite, energy and concentration as poor. Patient already having withdrawal from opioids.  Reports nausea, vomiting and diarrhea. Since discharge patient has nottaken any of his medications as he reported not having the founds to refill his prescriptions. denies h/o substance abuse to me.  His urine tox screen was + for cocaine during his last admission.  His urine tox this time was + for PCP.   PSYCHIATRIC HISTORY: History of abuse of cocaine, marijuana and prescription narcotics. History of behavior problems, manipulation and antisocial behavior, all around his pain medication; typically has poor insight about it" I however did not find records of a prior admission over the last 3 years. He does have a history of suicide attempts in the past by overdose.  HISTORY: He lives with a roommate. He has some family in the area (father). He gets disability, although he says he has been doing some side work from time to time recently.  HISTORY: He denies any family history of mental illness or  substance abuse problems.  MEDICAL HISTORY: Chronic headaches neck, DDD and lumbago. He has a mild high blood pressure and gastric reflux symptoms.    ALLERGIES: HYDROCODONE, INTERESTINGLY, IS REPORTED AS WELL AS PENICILLIN.  OF SYSTEMS: Complains of severe low back pain, diarrhea and nausea. Depressed mood. Irritability. Positive suicidal thoughts. No homicidal ideation. The rest of the review of systems is negative.  STATUS EXAMINATION:  Appearance:  white male who appears his stated agepleasant and cooperativeactivity: mildly decreasedcontact: wnlregular tone, volume and rateprocess: linearcontent: neg for HI, or A/V H.  + suicidal ideationdysphoriccongruent, blunted.fairfairexam:and oriented times 4of knowledge is averageand concentration: grossly intact.   RESULTS: urine toxicology + for pcp,opiates and benzos.  EXAMINATION: SIGNS: Current blood pressure 117/83, respirations 18, pulse 109, temperature 98.9. normal muscular tone, slow gait and no evidence of involuntary movements. This is a 49 year old male with a history of drug dependence and chronic pain who presented to our ED after an overdose on Phenergan.  depressive disorder severe recurrentsymptom disorder (pain disorder)use disorderuse disorder severeused disordermigrainesback pain   PLAN: Admit to psychiatry for safety. Suicide precautionswill start the pt on sertraline 50 mg po qday pain:will not be restarted on c oxycodone as he did not refills by pain clinic. gabapentin 400 mg tidstart pt on Lidoderm patches for back pain withdrawal:start Imodium tidoffer ibuprofen 600 mg tid for 2 dayshave Tylenol as a prn continue trazodone 150 mgpo qhs Topamax 50 mg qhs Lisinopril 20 mgpo q day increase Pantoprazole to 40  mgpo q bid as pt will be receiving NSAIDs during withdrawal.   Electronic Signatures: Hildred Priest (MD)  (Signed on 29-Oct-15 15:42)  Authored  Last Updated: 29-Oct-15 15:42 by Hildred Priest  (MD)

## 2015-01-10 DIAGNOSIS — H65111 Acute and subacute allergic otitis media (mucoid) (sanguinous) (serous), right ear: Secondary | ICD-10-CM | POA: Diagnosis not present

## 2015-01-10 DIAGNOSIS — H9209 Otalgia, unspecified ear: Secondary | ICD-10-CM | POA: Diagnosis not present

## 2015-01-16 DIAGNOSIS — H65119 Acute and subacute allergic otitis media (mucoid) (sanguinous) (serous), unspecified ear: Secondary | ICD-10-CM | POA: Diagnosis not present

## 2015-01-16 DIAGNOSIS — H608X1 Other otitis externa, right ear: Secondary | ICD-10-CM | POA: Diagnosis not present

## 2015-01-16 DIAGNOSIS — H9209 Otalgia, unspecified ear: Secondary | ICD-10-CM | POA: Diagnosis not present

## 2016-09-07 ENCOUNTER — Emergency Department: Payer: No Typology Code available for payment source

## 2016-09-07 ENCOUNTER — Encounter: Payer: Self-pay | Admitting: Emergency Medicine

## 2016-09-07 ENCOUNTER — Emergency Department
Admission: EM | Admit: 2016-09-07 | Discharge: 2016-09-07 | Disposition: A | Discharge status: Home / Self Care | Payer: No Typology Code available for payment source | Attending: Emergency Medicine | Admitting: Emergency Medicine

## 2016-09-07 DIAGNOSIS — I10 Essential (primary) hypertension: Secondary | ICD-10-CM | POA: Insufficient documentation

## 2016-09-07 DIAGNOSIS — R1084 Generalized abdominal pain: Secondary | ICD-10-CM | POA: Diagnosis not present

## 2016-09-07 DIAGNOSIS — F17200 Nicotine dependence, unspecified, uncomplicated: Secondary | ICD-10-CM | POA: Diagnosis not present

## 2016-09-07 DIAGNOSIS — R112 Nausea with vomiting, unspecified: Secondary | ICD-10-CM | POA: Diagnosis not present

## 2016-09-07 HISTORY — DX: Benign neoplasm of brain, unspecified: D33.2

## 2016-09-07 HISTORY — DX: Essential (primary) hypertension: I10

## 2016-09-07 LAB — LIPASE, BLOOD: Lipase: 38 U/L (ref 11–51)

## 2016-09-07 LAB — CBC
HCT: 46.4 % (ref 40.0–52.0)
HEMOGLOBIN: 16.2 g/dL (ref 13.0–18.0)
MCH: 31 pg (ref 26.0–34.0)
MCHC: 34.9 g/dL (ref 32.0–36.0)
MCV: 88.8 fL (ref 80.0–100.0)
PLATELETS: 330 10*3/uL (ref 150–440)
RBC: 5.22 MIL/uL (ref 4.40–5.90)
RDW: 14 % (ref 11.5–14.5)
WBC: 15.5 10*3/uL — ABNORMAL HIGH (ref 3.8–10.6)

## 2016-09-07 LAB — COMPREHENSIVE METABOLIC PANEL
ALBUMIN: 4.5 g/dL (ref 3.5–5.0)
ALK PHOS: 85 U/L (ref 38–126)
ALT: 48 U/L (ref 17–63)
ANION GAP: 11 (ref 5–15)
AST: 35 U/L (ref 15–41)
BILIRUBIN TOTAL: 0.7 mg/dL (ref 0.3–1.2)
BUN: 12 mg/dL (ref 6–20)
CALCIUM: 9.8 mg/dL (ref 8.9–10.3)
CO2: 26 mmol/L (ref 22–32)
CREATININE: 1.12 mg/dL (ref 0.61–1.24)
Chloride: 99 mmol/L — ABNORMAL LOW (ref 101–111)
GFR calc non Af Amer: >60 mL/min (ref >60)
GLUCOSE: 126 mg/dL — AB (ref 65–99)
Potassium: 4 mmol/L (ref 3.5–5.1)
Sodium: 136 mmol/L (ref 135–145)
TOTAL PROTEIN: 8 g/dL (ref 6.5–8.1)

## 2016-09-07 LAB — URINALYSIS, COMPLETE (UACMP) WITH MICROSCOPIC
Bilirubin Urine: NEGATIVE
GLUCOSE, UA: NEGATIVE mg/dL
HGB URINE DIPSTICK: NEGATIVE
Ketones, ur: NEGATIVE mg/dL
Leukocytes, UA: NEGATIVE
NITRITE: NEGATIVE
PH: 7 (ref 5.0–8.0)
Protein, ur: 100 mg/dL — AB
SPECIFIC GRAVITY, URINE: 1.021 (ref 1.005–1.030)
Squamous Epithelial / LPF: NONE SEEN

## 2016-09-07 MED ORDER — ONDANSETRON 4 MG PO TBDP: 4.0000 mg | ORAL_TABLET | Freq: Three times a day (TID) | ORAL | 0 refills | Status: DC | PRN

## 2016-09-07 MED ORDER — KETOROLAC TROMETHAMINE 30 MG/ML IJ SOLN
INTRAMUSCULAR | Status: AC
  Administered 2016-09-07: 15 mg via INTRAVENOUS
  Filled 2016-09-07: qty 1

## 2016-09-07 MED ORDER — SODIUM CHLORIDE 0.9 % IV BOLUS (SEPSIS)
1000.0000 mL | Freq: Once | INTRAVENOUS | Status: AC
  Administered 2016-09-07: 1000 mL via INTRAVENOUS

## 2016-09-07 MED ORDER — ONDANSETRON HCL 4 MG/2ML IJ SOLN
4.0000 mg | Freq: Once | INTRAMUSCULAR | Status: AC
  Administered 2016-09-07: 4 mg via INTRAVENOUS

## 2016-09-07 MED ORDER — KETOROLAC TROMETHAMINE 30 MG/ML IJ SOLN
15.0000 mg | Freq: Once | INTRAMUSCULAR | Status: AC
  Administered 2016-09-07: 15 mg via INTRAVENOUS

## 2016-09-07 MED ORDER — FENTANYL CITRATE (PF) 100 MCG/2ML IJ SOLN
75.0000 ug | Freq: Once | INTRAMUSCULAR | Status: AC
  Administered 2016-09-07: 75 ug via INTRAVENOUS

## 2016-09-07 MED ORDER — FENTANYL CITRATE (PF) 100 MCG/2ML IJ SOLN
INTRAMUSCULAR | Status: AC
  Administered 2016-09-07: 75 ug via INTRAVENOUS
  Filled 2016-09-07: qty 2

## 2016-09-07 NOTE — Discharge Instructions (Signed)
Fortunately today your blood work and your CT scan were very reassuring. Please make sure you remain well-hydrated and follow-up with your primary care physician on Monday for a recheck. Return to the emergency department sooner for any new or worsening symptoms such as worsening pain, fevers, chills, if you cannot eat or drink, or for any other concerns.  It was a pleasure to take care of you today, and thank you for coming to our emergency department.  If you have any questions or concerns before leaving please ask the nurse to grab me and I'm more than happy to go through your aftercare instructions again.  If you were prescribed any opioid pain medication today such as Norco, Vicodin, Percocet, morphine, hydrocodone, or oxycodone please make sure you do not drive when you are taking this medication as it can alter your ability to drive safely.  If you have any concerns once you are home that you are not improving or are in fact getting worse before you can make it to your follow-up appointment, please do not hesitate to call 911 and come back for further evaluation.  Darel Hong MD  Results for orders placed or performed during the hospital encounter of 09/07/16  Lipase, blood  Result Value Ref Range   Lipase 38 11 - 51 U/L  Comprehensive metabolic panel  Result Value Ref Range   Sodium 136 135 - 145 mmol/L   Potassium 4.0 3.5 - 5.1 mmol/L   Chloride 99 (L) 101 - 111 mmol/L   CO2 26 22 - 32 mmol/L   Glucose, Bld 126 (H) 65 - 99 mg/dL   BUN 12 6 - 20 mg/dL   Creatinine, Ser 1.12 0.61 - 1.24 mg/dL   Calcium 9.8 8.9 - 10.3 mg/dL   Total Protein 8.0 6.5 - 8.1 g/dL   Albumin 4.5 3.5 - 5.0 g/dL   AST 35 15 - 41 U/L   ALT 48 17 - 63 U/L   Alkaline Phosphatase 85 38 - 126 U/L   Total Bilirubin 0.7 0.3 - 1.2 mg/dL   GFR calc non Af Amer >60 >60 mL/min   GFR calc Af Amer >60 >60 mL/min   Anion gap 11 5 - 15  CBC  Result Value Ref Range   WBC 15.5 (H) 3.8 - 10.6 K/uL   RBC 5.22 4.40 -  5.90 MIL/uL   Hemoglobin 16.2 13.0 - 18.0 g/dL   HCT 46.4 40.0 - 52.0 %   MCV 88.8 80.0 - 100.0 fL   MCH 31.0 26.0 - 34.0 pg   MCHC 34.9 32.0 - 36.0 g/dL   RDW 14.0 11.5 - 14.5 %   Platelets 330 150 - 440 K/uL  Urinalysis, Complete w Microscopic  Result Value Ref Range   Color, Urine YELLOW (A) YELLOW   APPearance CLEAR (A) CLEAR   Specific Gravity, Urine 1.021 1.005 - 1.030   pH 7.0 5.0 - 8.0   Glucose, UA NEGATIVE NEGATIVE mg/dL   Hgb urine dipstick NEGATIVE NEGATIVE   Bilirubin Urine NEGATIVE NEGATIVE   Ketones, ur NEGATIVE NEGATIVE mg/dL   Protein, ur 100 (A) NEGATIVE mg/dL   Nitrite NEGATIVE NEGATIVE   Leukocytes, UA NEGATIVE NEGATIVE   RBC / HPF 0-5 0 - 5 RBC/hpf   WBC, UA 0-5 0 - 5 WBC/hpf   Bacteria, UA RARE (A) NONE SEEN   Squamous Epithelial / LPF NONE SEEN NONE SEEN   Mucous PRESENT    Ct Renal Stone Study  Result Date: 09/07/2016 CLINICAL DATA:  LEFT lower quadrant pain radiating to back beginning yesterday evening. Decreased urine output. History of hernia repair and appendectomy. EXAM: CT ABDOMEN AND PELVIS WITHOUT CONTRAST TECHNIQUE: Multidetector CT imaging of the abdomen and pelvis was performed following the standard protocol without IV contrast. COMPARISON:  CT abdomen and pelvis October 10, 2010 FINDINGS: LOWER CHEST: Lung bases are clear. The visualized heart size is normal. No pericardial effusion. HEPATOBILIARY: Low-density liver compatible with steatosis, mild focal fatty sparing about the gallbladder fossa. Normal gallbladder. PANCREAS: Normal. SPLEEN: Normal. ADRENALS/URINARY TRACT: Kidneys are orthotopic, demonstrating normal size and morphology. No nephrolithiasis, hydronephrosis; limited assessment for renal masses on this nonenhanced examination. The unopacified ureters are normal in course and caliber. Urinary bladder is partially distended, urachal remanent. Normal adrenal glands. STOMACH/BOWEL: The stomach, small and large bowel are normal in course and  caliber without inflammatory changes, sensitivity decreased by lack of enteric contrast. Fluid filled nondistended small bowel LEFT mid abdomen. Mild colonic diverticulosis. VASCULAR/LYMPHATIC: Aortoiliac vessels are normal in course and caliber, trace calcific atherosclerosis. No lymphadenopathy by CT size criteria. REPRODUCTIVE: Normal. OTHER: No intraperitoneal free fluid or free air. 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 MUSCULOSKELETAL: Non-acute.  Tiny fat containing umbilical hernia. IMPRESSION: No urolithiasis, obstructive uropathy nor acute intra-abdominal/ pelvic process. Electronically Signed   By: Elon Alas M.D.   On: 09/07/2016 04:50

## 2016-09-07 NOTE — ED Notes (Signed)
Dr. Rifenbark at bedside 

## 2016-09-07 NOTE — ED Provider Notes (Signed)
[facility] Emergency Department Provider Note  ____________________________________________   First MD Initiated Contact with Patient 09/07/16 0410     (approximate)  I have reviewed the triage vital signs and the nursing notes.   HISTORY  Chief Complaint Abdominal Pain    HPI [name]. is a 51 y.o. male who comes to the emergency department with severe left flank pain radiating towards his left groin associated with nausea and vomiting that began earlier today. Pain was most intense roughly around 9 PM and has persisted until now for the past 7 hours. He has refused to come to the emergency department but after vomiting so much his wife made him. He's never had a kidney stone before but he has a strong family history. 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 He is passing flatus.He does have a previous history of gastric ulcers for which she continues to take daily omeprazole.   Past Medical History:  Diagnosis Date  . Brain tumor (benign) ([place])   . Hypertension     There are no active problems to display for this patient.   Past Surgical History:  Procedure Laterality Date  . VENTRICULOPERITONEAL SHUNT      Prior to Admission medications   Medication Sig Start Date End Date Taking? Authorizing Provider  amLODipine (NORVASC) 10 MG tablet Take 10 mg by mouth 2 (two) times daily.   Yes [provider]  metoprolol succinate (TOPROL-XL) 25 MG 24 hr tablet Take 25 mg by mouth daily.   Yes [provider]  ondansetron (ZOFRAN ODT) 4 MG disintegrating tablet Take 1 tablet (4 mg total) by mouth every 8 (eight) hours as needed for nausea or vomiting. 09/07/16   Darel Hong, MD    Allergies Penicillins  History reviewed. No pertinent family history.  Social History Social History  Substance Use Topics  . Smoking status: Current Some Day Smoker  . Smokeless  tobacco: Never Used  . Alcohol use Yes    Review of Systems Constitutional: No fever/chills Eyes: No visual changes. ENT: No sore throat. Cardiovascular: Denies chest pain. Respiratory: Denies shortness of breath. Gastrointestinal: Positive abdominal pain.  Positive nausea, Positive vomiting.  No diarrhea.  No constipation. Genitourinary: Negative for dysuria. Musculoskeletal: Negative for back pain. Skin: Negative for rash. Neurological: Negative for headaches, focal weakness or numbness.   ____________________________________________   PHYSICAL EXAM:  VITAL SIGNS: ED Triage Vitals  Enc Vitals Group     BP 09/07/16 0407 (!) 152/108     Pulse Rate 09/07/16 0407 82     Resp 09/07/16 0407 18     Temp 09/07/16 0407 98.4 F (36.9 C)     Temp Source 09/07/16 0407 Oral     SpO2 09/07/16 0407 95 %     Weight 09/07/16 0407 180 lb (81.6 kg)     Height 09/07/16 0407 5\' 4"  (1.626 m)     Head Circumference --      Peak Flow --      Pain Score 09/07/16 0406 9     Pain Loc --      Pain Edu? --      Excl. in [place]? --     Constitutional: Alert and oriented 4 sitting bolt upright retching and vomiting into a bag Eyes: PERRL EOMI. Head: Atraumatic. Nose: No congestion/rhinnorhea. Mouth/Throat: No trismus Neck: No stridor.   Cardiovascular: Normal rate, regular rhythm. Grossly normal heart  sounds.  Good peripheral circulation. Respiratory: Normal respiratory effort.  No retractions. Lungs CTAB and moving good air Gastrointestinal: Soft nondistended mild diffuse tenderness without focality no McBurney's tenderness negative Rovsing's no costovertebral tenderness Musculoskeletal: No lower extremity edema   Neurologic:  Normal speech and language. No gross focal neurologic deficits are appreciated. Skin:  Skin is warm, dry and intact. No rash noted. Psychiatric: Mood and affect are normal. Speech and behavior are normal.    ____________________________________________     DIFFERENTIAL includes but not limited to  Renal colic, appendicitis, gastric ulcer, pneumonia, Boerhaave syndrome, pyelonephritis ____________________________________________   LABS (all labs ordered are listed, but only abnormal results are displayed)  Labs Reviewed  COMPREHENSIVE METABOLIC PANEL - Abnormal; Notable for the following:       Result Value   Chloride 99 (*)    Glucose, Bld 126 (*)    All other components within normal limits  CBC - Abnormal; Notable for the following:    WBC 15.5 (*)    All other components within normal limits  URINALYSIS, COMPLETE (UACMP) WITH MICROSCOPIC - Abnormal; Notable for the following:    Color, Urine YELLOW (*)    APPearance CLEAR (*)    Protein, ur 100 (*)    Bacteria, UA RARE (*)    All other components within normal limits  LIPASE, BLOOD    Elevated white count is nonspecific and likely secondary to stress __________________________________________  EKG  ED ECG REPORT I, Darel Hong, the attending physician, personally viewed and interpreted this ECG.  Date: 09/07/2016 Rate: 98 Rhythm: normal sinus rhythm QRS Axis: normal Intervals: normal ST/T Wave abnormalities: normal Narrative Interpretation: unremarkable  ____________________________________________  RADIOLOGY  CT scan with no acute abdominal abnormality ____________________________________________   PROCEDURES  Procedure(s) performed: no  Procedures  Critical Care performed: no  Observation: no ____________________________________________   INITIAL IMPRESSION / ASSESSMENT AND PLAN / ED COURSE  Pertinent labs & imaging results that were available during my care of the patient were reviewed by me and considered in my medical decision making (see chart for details).  The patient arrived extremely uncomfortable appearing sitting upright retching and vomiting. Differential is broad but with left flank pain radiating towards his groin and vomiting  and concerned that he may have a kidney stone. CT scan is pending along with Toradol and fentanyl.  Fortunately the patient's symptoms have now resolved and he is no longer vomiting. His CT scan is negative for acute abdominal pathology. I had a lengthy discussion with the patient and his wife at bedside regarding the diagnostic uncertainty and that this may represent a gastric ulcer versus food poisoning. They're both comfortable going home with Zofran and following up with primary care on Monday. Strict return precautions given.      ____________________________________________   FINAL CLINICAL IMPRESSION(S) / ED DIAGNOSES  Final diagnoses:  Generalized abdominal pain  Non-intractable vomiting with nausea, unspecified vomiting type      NEW MEDICATIONS STARTED DURING THIS VISIT:  New Prescriptions   ONDANSETRON (ZOFRAN ODT) 4 MG DISINTEGRATING TABLET    Take 1 tablet (4 mg total) by mouth every 8 (eight) hours as needed for nausea or vomiting.     Note:  This document was prepared using Dragon voice recognition software and may include unintentional dictation errors.     Darel Hong, MD 09/07/16 [phone]

## 2016-09-07 NOTE — ED Triage Notes (Signed)
Pt ambulatory to triage in NAD, report LLQ pain radiating to back starting yesterday evening.  Reports pain comes in waves w/ n/v.  Pt denies diarrhea.  Pt reports decreased urinary output, denies blood in urine

## 2016-09-07 NOTE — ED Notes (Signed)
Pt taken to CT via WC

## 2018-06-21 IMAGING — CT CT RENAL STONE PROTOCOL
2 of 4 series · 16 of 46 positions shown, 18 images · non-contrast
Comparison: CT abdomen and pelvis October 10, 2010

CLINICAL DATA: LEFT lower quadrant pain radiating to back beginning
yesterday evening. Decreased urine output. History of hernia repair
and appendectomy.

EXAM:
CT ABDOMEN AND PELVIS WITHOUT CONTRAST
TECHNIQUE: Multidetector CT imaging of the abdomen and pelvis was performed
following the standard protocol without IV contrast.

[Series 2: stone full standard · axial · 0.81mm/px · z∈[-880,-450]mm · 13 of 94 slices shown, 15 images]
[im 4/94  soft-tissue]
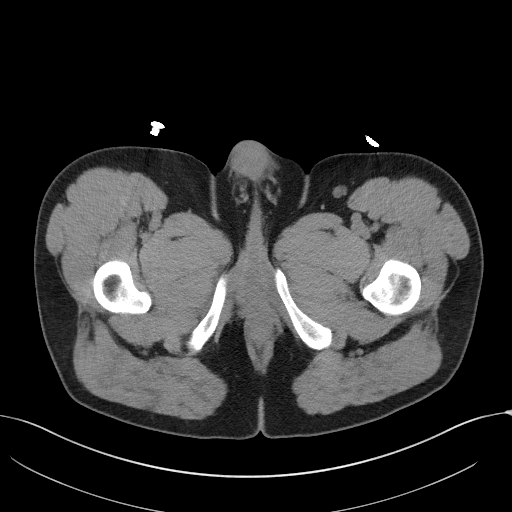
[im 4/94  bone]
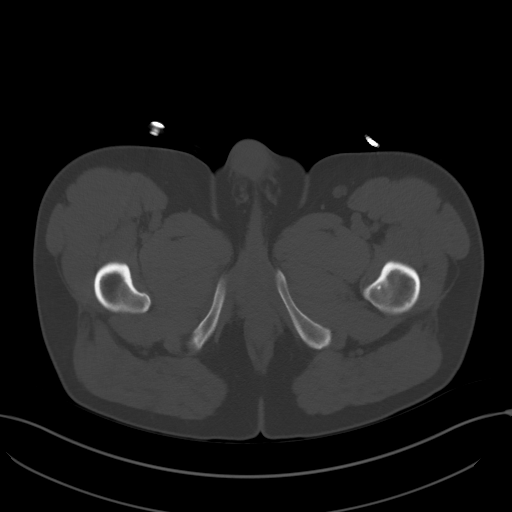
[im 12/94  soft-tissue]
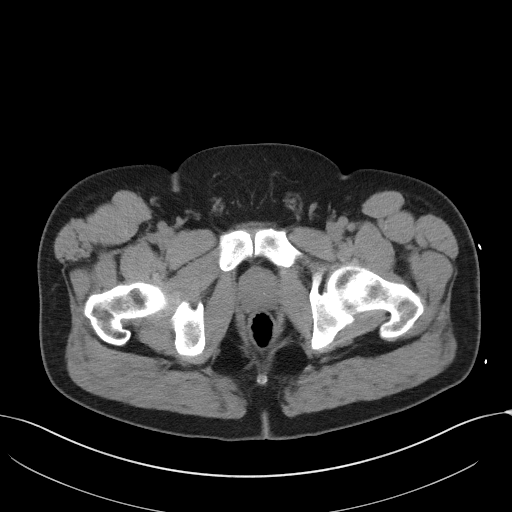
[im 20/94  soft-tissue]
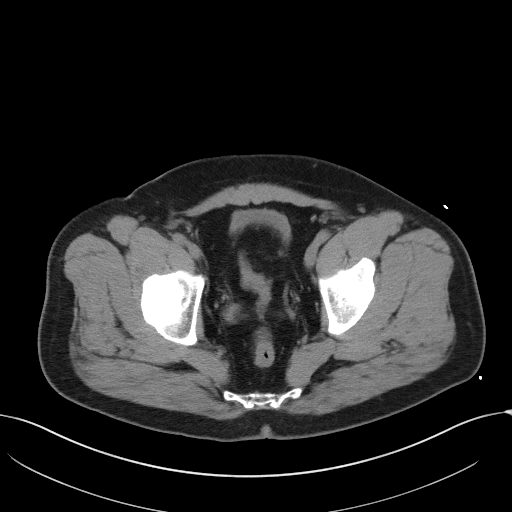
[im 28/94  soft-tissue]
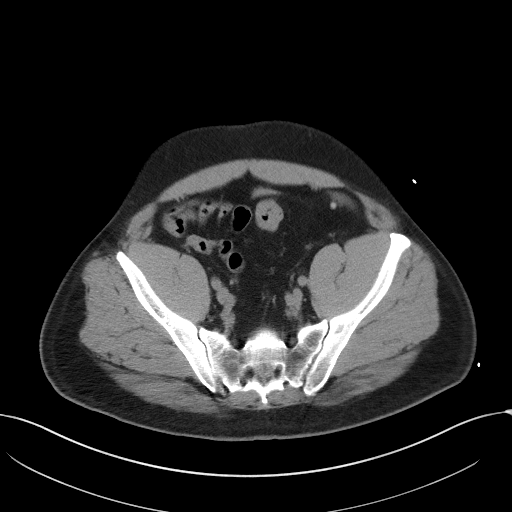
[im 32/94  soft-tissue]
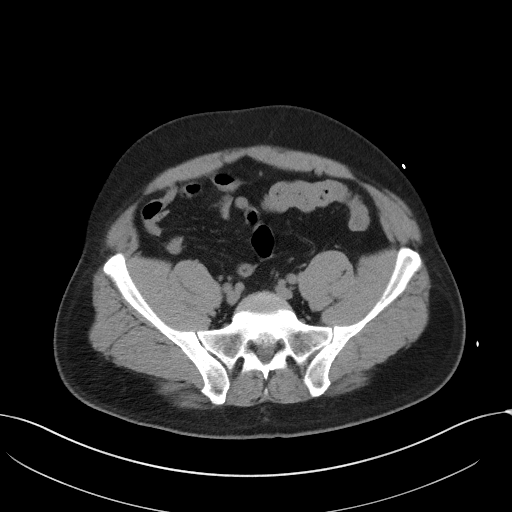
[im 39/94  soft-tissue]
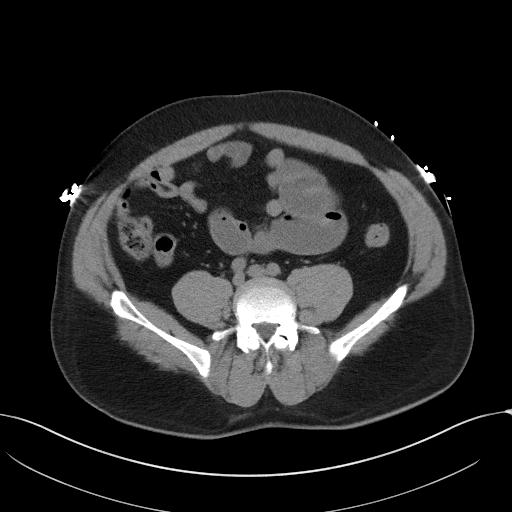
[im 47/94  soft-tissue]
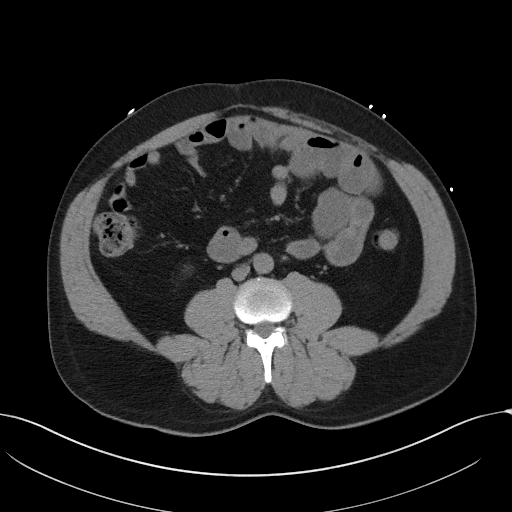
[im 55/94  soft-tissue]
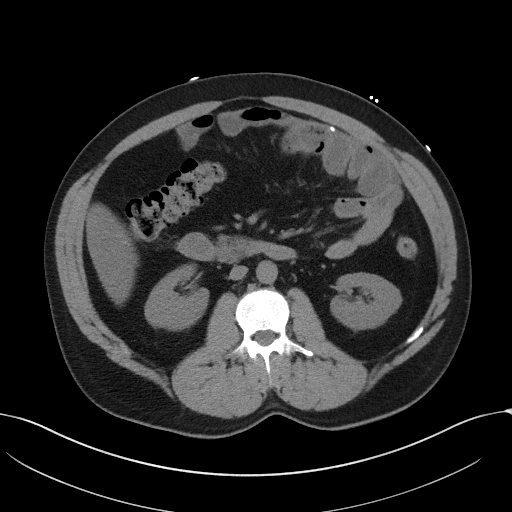
[im 63/94  soft-tissue]
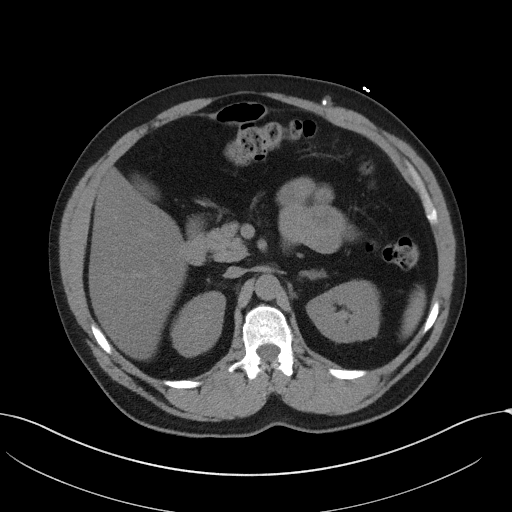
[im 63/94  bone]
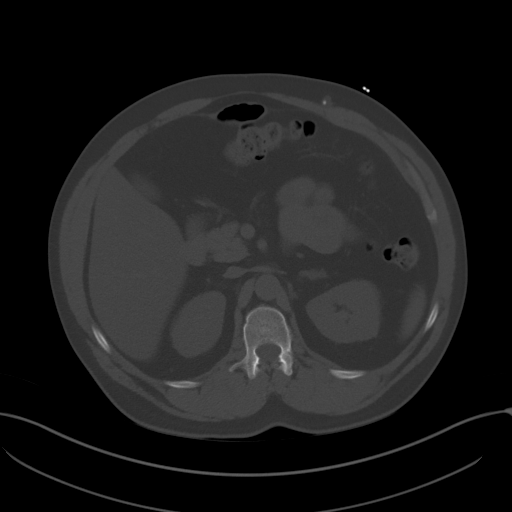
[im 66/94  soft-tissue]
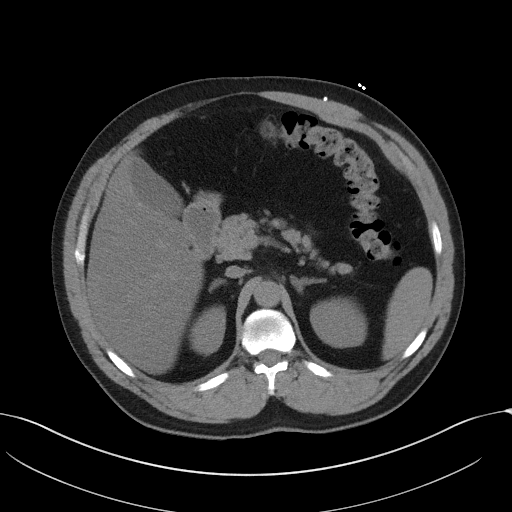
[im 74/94  soft-tissue]
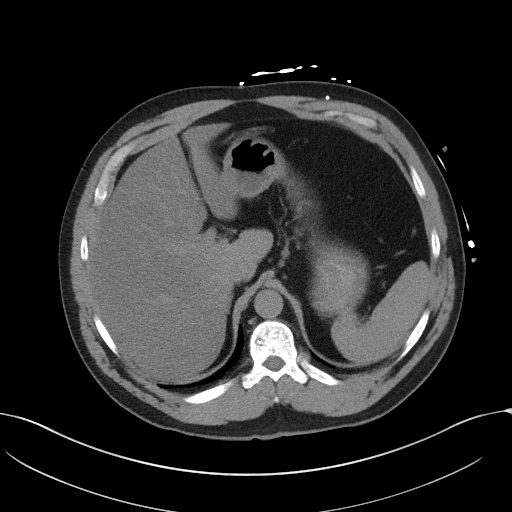
[im 82/94  soft-tissue]
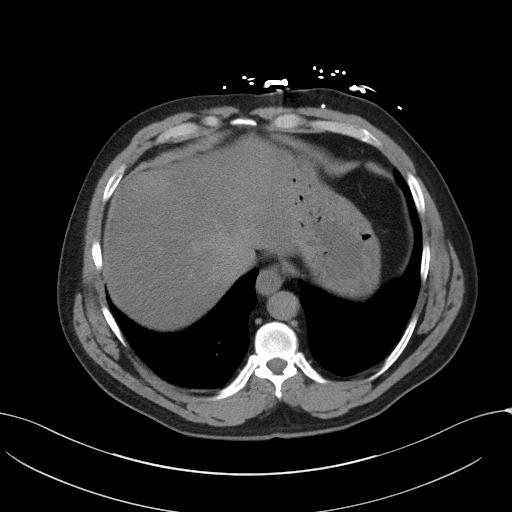
[im 90/94  soft-tissue]
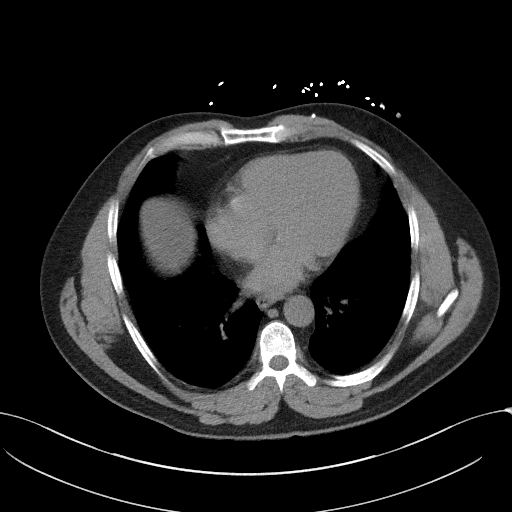

[Series 5: coronal · coronal · 0.82mm/px · 3 of 167 slices shown]
[im 56/167  soft-tissue]
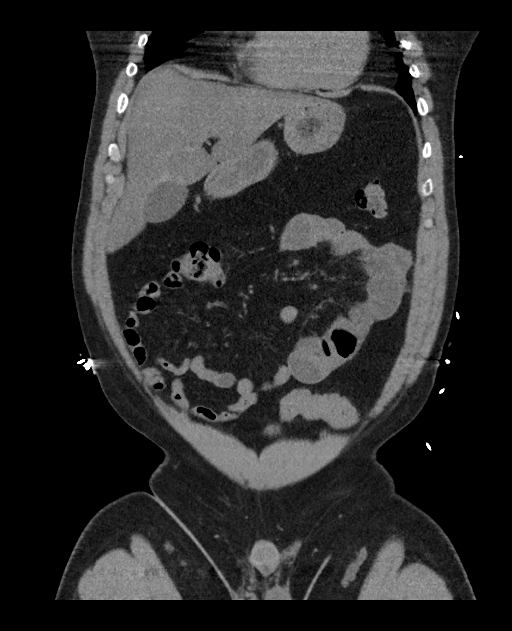
[im 74/167  soft-tissue]
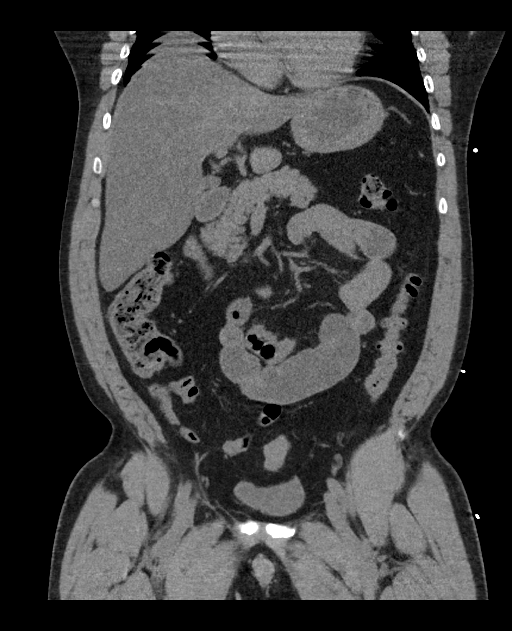
[im 93/167  soft-tissue]
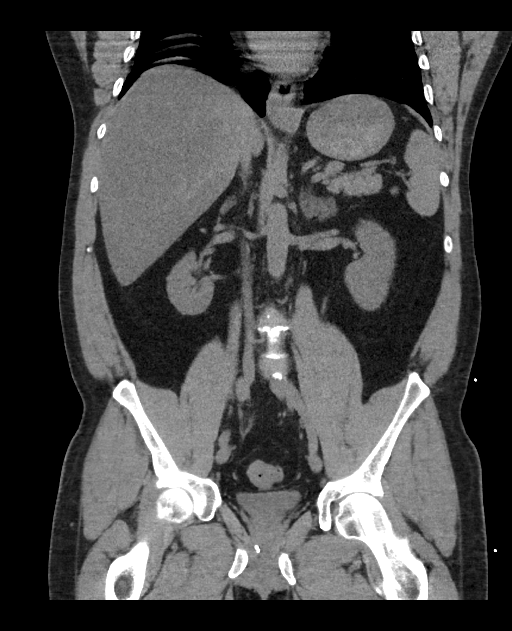

[16 of 46 positions shown; findings below may reference images not displayed]

FINDINGS: LOWER CHEST: Lung bases are clear. The visualized heart size is
normal. No pericardial effusion.

HEPATOBILIARY: Low-density liver compatible with steatosis, mild
focal fatty sparing about the gallbladder fossa. Normal gallbladder.

PANCREAS: Normal.

SPLEEN: Normal.

ADRENALS/URINARY TRACT: Kidneys are orthotopic, demonstrating normal
size and morphology. No nephrolithiasis, hydronephrosis; limited
assessment for renal masses on this nonenhanced examination. The
unopacified ureters are normal in course and caliber. Urinary
bladder is partially distended, urachal remanent. Normal adrenal
glands.

STOMACH/BOWEL: The stomach, small and large bowel are normal in
course and caliber without inflammatory changes, sensitivity
decreased by lack of enteric contrast. Fluid filled nondistended
small bowel LEFT mid abdomen. Mild colonic diverticulosis.

VASCULAR/LYMPHATIC: Aortoiliac vessels are normal in course and
caliber, trace calcific atherosclerosis. No lymphadenopathy by CT
size criteria.

REPRODUCTIVE: Normal.

OTHER: No intraperitoneal free fluid or free air.
Ventriculoperitoneal shunt terminates in LEFT mid abdomen, proximal
catheter not imaged.

MUSCULOSKELETAL: Non-acute.  Tiny fat containing umbilical hernia.
IMPRESSION: No urolithiasis, obstructive uropathy nor acute intra-abdominal/
pelvic process.

## 2018-12-11 ENCOUNTER — Other Ambulatory Visit: Payer: Self-pay | Admitting: Internal Medicine

## 2018-12-11 NOTE — Progress Notes (Signed)
PDMP reviewed for Dr [name] to prescribe him Norco.

## 2018-12-29 ENCOUNTER — Other Ambulatory Visit: Payer: Self-pay | Admitting: *Deleted

## 2018-12-29 DIAGNOSIS — Z20822 Contact with and (suspected) exposure to covid-19: Secondary | ICD-10-CM

## 2018-12-31 LAB — NOVEL CORONAVIRUS, NAA: SARS-CoV-2, NAA: NOT DETECTED

## 2019-02-01 ENCOUNTER — Other Ambulatory Visit: Payer: Self-pay

## 2019-02-01 DIAGNOSIS — Z20822 Contact with and (suspected) exposure to covid-19: Secondary | ICD-10-CM

## 2019-02-02 LAB — INPATIENT

## 2019-02-02 LAB — NOVEL CORONAVIRUS, NAA: SARS-CoV-2, NAA: NOT DETECTED

## 2019-11-15 ENCOUNTER — Other Ambulatory Visit: Payer: Self-pay | Admitting: *Deleted

## 2019-11-15 MED ORDER — METOPROLOL SUCCINATE ER 25 MG PO TB24: 25.0000 mg | ORAL_TABLET | Freq: Every day | ORAL | 6 refills | Status: DC

## 2019-12-09 ENCOUNTER — Other Ambulatory Visit: Payer: Self-pay

## 2019-12-09 MED ORDER — PANTOPRAZOLE SODIUM 40 MG PO TBEC: 40.0000 mg | DELAYED_RELEASE_TABLET | Freq: Every day | ORAL | 3 refills | Status: DC

## 2020-01-13 ENCOUNTER — Ambulatory Visit
Admission: RE | Admit: 2020-01-13 | Discharge: 2020-01-13 | Disposition: A | Discharge status: Home / Self Care | Payer: 59 | Source: Ambulatory Visit | Attending: Family Medicine | Admitting: Family Medicine

## 2020-01-13 ENCOUNTER — Ambulatory Visit
Admission: RE | Admit: 2020-01-13 | Discharge: 2020-01-13 | Disposition: A | Discharge status: Home / Self Care | Payer: 59 | Attending: Family Medicine | Admitting: Family Medicine

## 2020-01-13 ENCOUNTER — Other Ambulatory Visit: Payer: Self-pay

## 2020-01-13 ENCOUNTER — Ambulatory Visit (INDEPENDENT_AMBULATORY_CARE_PROVIDER_SITE_OTHER): Payer: 59 | Admitting: Family Medicine

## 2020-01-13 ENCOUNTER — Encounter: Payer: Self-pay | Admitting: Family Medicine

## 2020-01-13 VITALS — BP 135/87 | HR 93 | Ht 63.0 in | Wt 181.6 lb

## 2020-01-13 DIAGNOSIS — I10 Essential (primary) hypertension: Secondary | ICD-10-CM | POA: Diagnosis not present

## 2020-01-13 DIAGNOSIS — M542 Cervicalgia: Secondary | ICD-10-CM | POA: Diagnosis not present

## 2020-01-13 MED ORDER — CYCLOBENZAPRINE HCL 10 MG PO TABS: 10.0000 mg | ORAL_TABLET | Freq: Three times a day (TID) | ORAL | 0 refills | Status: DC | PRN

## 2020-01-13 NOTE — Progress Notes (Addendum)
Established Patient Office Visit  SUBJECTIVE:  Subjective  Patient ID: [name]., male    DOB: 06-26-1965  Age: 54 y.o. MRN: [id number]  CC:  Chief Complaint  Patient presents with  . Neck Pain    Patient complains of neck pain, difficulty moving head in any direction.    HPI [name]. is a 54 y.o. male presenting today for acute neck pain x 1 month. He has significant hx of Cervical fx cspine in the past. No acute injury but he consistantly lifts heavy objects at work.     Past Medical History:  Diagnosis Date  . Brain tumor (benign) ([place])   . Hypertension     Past Surgical History:  Procedure Laterality Date  . VENTRICULOPERITONEAL SHUNT      History reviewed. No pertinent family history.  Social History   Socioeconomic History  . Marital status: Divorced    Spouse name: Not on file  . Number of children: Not on file  . Years of education: Not on file  . Highest education level: Not on file  Occupational History  . Not on file  Tobacco Use  . Smoking status: Current Some Day Smoker  . Smokeless tobacco: Never Used  Substance and Sexual Activity  . Alcohol use: Yes  . Drug use: Not on file  . Sexual activity: Not on file  Other Topics Concern  . Not on file  Social History Narrative  . Not on file   Social Determinants of Health   Financial Resource Strain:   . Difficulty of Paying Living Expenses: Not on file  Food Insecurity:   . Worried About Charity fundraiser in the Last Year: Not on file  . Ran Out of Food in the Last Year: Not on file  Transportation Needs:   . Lack of Transportation (Medical): Not on file  . Lack of Transportation (Non-Medical): Not on file  Physical Activity:   . Days of Exercise per Week: Not on file  . Minutes of Exercise per Session: Not on file  Stress:   . Feeling of Stress : Not on file  Social Connections:   . Frequency of Communication with Friends and Family: Not on file  . Frequency of  Social Gatherings with Friends and Family: Not on file  . Attends Religious Services: Not on file  . Active Member of Clubs or Organizations: Not on file  . Attends Archivist Meetings: Not on file  . Marital Status: Not on file  Intimate Partner Violence:   . Fear of Current or Ex-Partner: Not on file  . Emotionally Abused: Not on file  . Physically Abused: Not on file  . Sexually Abused: Not on file     Current Outpatient Medications:  .  amLODipine (NORVASC) 10 MG tablet, Take 10 mg by mouth 2 (two) times daily., Disp: , Rfl:  .  metoprolol succinate (TOPROL-XL) 25 MG 24 hr tablet, Take 1 tablet (25 mg total) by mouth daily., Disp: 30 tablet, Rfl: 6 .  pantoprazole (PROTONIX) 40 MG tablet, Take 1 tablet (40 mg total) by mouth daily., Disp: 30 tablet, Rfl: 3 .  cyclobenzaprine (FLEXERIL) 10 MG tablet, Take 1 tablet (10 mg total) by mouth 3 (three) times daily as needed for muscle spasms., Disp: 30 tablet, Rfl: 0   Allergies  Allergen Reactions  . Penicillins     ROS Review of Systems  Constitutional: Negative.   HENT: Negative.   Eyes: Negative.  Respiratory: Negative.   Cardiovascular: Negative.   Musculoskeletal: Positive for neck pain and neck stiffness. Negative for back pain and gait problem.  Neurological: Positive for numbness. Negative for headaches.  Hematological: Negative.   Psychiatric/Behavioral: Negative.      OBJECTIVE:    Physical Exam Vitals and nursing note reviewed.  Constitutional:      Appearance: Normal appearance.     Comments: Neck Pain increases with R arm raise.   HENT:     Head: Normocephalic and atraumatic.     Nose: Nose normal.     Mouth/Throat:     Mouth: Mucous membranes are moist.  Eyes:     Pupils: Pupils are equal, round, and reactive to light.  Pulmonary:     Effort: Pulmonary effort is normal.  Abdominal:     General: Abdomen is flat.  Musculoskeletal:     Cervical back: Neck supple. Tenderness present.    Neurological:     General: No focal deficit present.     Mental Status: He is alert.     BP 135/87   Pulse 93   Ht 5\' 3"  (1.6 m)   Wt 181 lb 9.6 oz (82.4 kg)   BMI 32.17 kg/m  Wt Readings from Last 3 Encounters:  01/13/20 181 lb 9.6 oz (82.4 kg)  09/07/16 180 lb (81.6 kg)    Health Maintenance Due  Topic Date Due  . Hepatitis C Screening  Never done  . COVID-19 Vaccine (1) Never done  . HIV Screening  Never done  . TETANUS/TDAP  Never done  . COLONOSCOPY  Never done  . INFLUENZA VACCINE  10/17/2019    There are no preventive care reminders to display for this patient.  CBC Latest Ref Rng & Units 09/07/2016 01/11/2014 01/11/2014  WBC 3.8 - 10.6 K/uL 15.5(H) 12.3(H) 11.7(H)  Hemoglobin 13.0 - 18.0 g/dL 16.2 14.8 15.5  Hematocrit 40 - 52 % 46.4 44.0 48.0  Platelets 150 - 440 K/uL 330 242 272   CMP Latest Ref Rng & Units 09/07/2016 01/11/2014 01/11/2014  Glucose 65 - 99 mg/dL 126(H) 116(H) 117(H)  BUN 6 - 20 mg/dL 12 12 14   Creatinine 0.61 - 1.24 mg/dL 1.12 1.06 1.31(H)  Sodium 135 - 145 mmol/L 136 130(L) 132(L)  Potassium 3.5 - 5.1 mmol/L 4.0 3.5 4.5  Chloride 101 - 111 mmol/L 99(L) 93(L) 96(L)  CO2 22 - 32 mmol/L 26 25 28   Calcium 8.9 - 10.3 mg/dL 9.8 8.5 9.1  Total Protein 6.5 - 8.1 g/dL 8.0 7.7 8.2  Total Bilirubin 0.3 - 1.2 mg/dL 0.7 0.5 0.5  Alkaline Phos 38 - 126 U/L 85 113 124(H)  AST 15 - 41 U/L 35 27 24  ALT 17 - 63 U/L 48 31 34    Lab Results  Component Value Date   TSH 0.599 01/11/2014   Lab Results  Component Value Date   ALBUMIN 4.5 09/07/2016   ANIONGAP 11 09/07/2016   No results found for: CHOL, HDL, LDLCALC, CHOLHDL No results found for: TRIG No results found for: HGBA1C    ASSESSMENT & PLAN:   Problem List Items Addressed This Visit      Cardiovascular and Mediastinum   Primary hypertension    Controlled with meds, no CP, SOB or Headaches reported.         Other   Acute neck pain - Primary    Neck pain worse with R arm  movement, no step offs ROM limited. CSPINE xray ordered for today.  Relevant Orders   DG Cervical Spine Complete (Completed)      Meds ordered this encounter  Medications  . cyclobenzaprine (FLEXERIL) 10 MG tablet    Sig: Take 1 tablet (10 mg total) by mouth 3 (three) times daily as needed for muscle spasms.    Dispense:  30 tablet    Refill:  0      Follow-up: No follow-ups on file.    Beckie Salts, [facility] [address], [place], [place] [address]

## 2020-01-13 NOTE — Assessment & Plan Note (Signed)
Controlled with meds, no CP, SOB or Headaches reported.

## 2020-01-13 NOTE — Assessment & Plan Note (Signed)
Neck pain worse with R arm movement, no step offs ROM limited. CSPINE xray ordered for today.

## 2020-01-28 ENCOUNTER — Other Ambulatory Visit: Payer: Self-pay

## 2020-01-28 ENCOUNTER — Ambulatory Visit (INDEPENDENT_AMBULATORY_CARE_PROVIDER_SITE_OTHER): Payer: 59 | Admitting: Family Medicine

## 2020-01-28 ENCOUNTER — Encounter: Payer: Self-pay | Admitting: Family Medicine

## 2020-01-28 VITALS — BP 148/90 | HR 112 | Ht 63.0 in | Wt 178.0 lb

## 2020-01-28 DIAGNOSIS — I10 Essential (primary) hypertension: Secondary | ICD-10-CM

## 2020-01-28 MED ORDER — LISINOPRIL 10 MG PO TABS: 10.0000 mg | ORAL_TABLET | Freq: Every day | ORAL | 3 refills | Status: DC

## 2020-01-28 NOTE — Assessment & Plan Note (Addendum)
HTN fu taking Amlodipine and Toprol as rx, no ha, or cp , SOB. Plan- Starting Lisinopril 10 mg.

## 2020-01-28 NOTE — Progress Notes (Signed)
Established Patient Office Visit  SUBJECTIVE:  Subjective  Patient ID: [name]., male    DOB: 08-14-1965  Age: 54 y.o. MRN: [id number]  CC:  Chief Complaint  Patient presents with  . Neck Pain    Patient is here for a 2 week follow up from neck pain    HPI [name]. is a 54 y.o. male presenting today for htn follow up. Taking all meds as rx.   Past Medical History:  Diagnosis Date  . Brain tumor (benign) ([place])   . Hypertension     Past Surgical History:  Procedure Laterality Date  . VENTRICULOPERITONEAL SHUNT      History reviewed. No pertinent family history.  Social History   Socioeconomic History  . Marital status: Divorced    Spouse name: Not on file  . Number of children: Not on file  . Years of education: Not on file  . Highest education level: Not on file  Occupational History  . Not on file  Tobacco Use  . Smoking status: Current Some Day Smoker  . Smokeless tobacco: Never Used  Substance and Sexual Activity  . Alcohol use: Yes  . Drug use: Not on file  . Sexual activity: Not on file  Other Topics Concern  . Not on file  Social History Narrative  . Not on file   Social Determinants of Health   Financial Resource Strain:   . Difficulty of Paying Living Expenses: Not on file  Food Insecurity:   . Worried About Charity fundraiser in the Last Year: Not on file  . Ran Out of Food in the Last Year: Not on file  Transportation Needs:   . Lack of Transportation (Medical): Not on file  . Lack of Transportation (Non-Medical): Not on file  Physical Activity:   . Days of Exercise per Week: Not on file  . Minutes of Exercise per Session: Not on file  Stress:   . Feeling of Stress : Not on file  Social Connections:   . Frequency of Communication with Friends and Family: Not on file  . Frequency of Social Gatherings with Friends and Family: Not on file  . Attends Religious Services: Not on file  . Active Member of Clubs or  Organizations: Not on file  . Attends Archivist Meetings: Not on file  . Marital Status: Not on file  Intimate Partner Violence:   . Fear of Current or Ex-Partner: Not on file  . Emotionally Abused: Not on file  . Physically Abused: Not on file  . Sexually Abused: Not on file     Current Outpatient Medications:  .  amLODipine (NORVASC) 10 MG tablet, Take 10 mg by mouth 2 (two) times daily., Disp: , Rfl:  .  cyclobenzaprine (FLEXERIL) 10 MG tablet, Take 1 tablet (10 mg total) by mouth 3 (three) times daily as needed for muscle spasms., Disp: 30 tablet, Rfl: 0 .  lisinopril (ZESTRIL) 10 MG tablet, Take 1 tablet (10 mg total) by mouth daily., Disp: 90 tablet, Rfl: 3 .  metoprolol succinate (TOPROL-XL) 25 MG 24 hr tablet, Take 1 tablet (25 mg total) by mouth daily., Disp: 30 tablet, Rfl: 6 .  pantoprazole (PROTONIX) 40 MG tablet, Take 1 tablet (40 mg total) by mouth daily., Disp: 30 tablet, Rfl: 3   Allergies  Allergen Reactions  . Penicillins     ROS Review of Systems  HENT: Negative.   Respiratory: Negative.   Cardiovascular: Negative.  Musculoskeletal: Negative.   All other systems reviewed and are negative.    OBJECTIVE:    Physical Exam Vitals and nursing note reviewed.     BP (!) 148/90   Pulse (!) 112   Ht _0  (1.6 m)   Wt 178 lb (80.7 kg)   BMI 31.53 kg/m  Wt Readings from Last 3 Encounters:  01/28/20 178 lb (80.7 kg)  01/13/20 181 lb 9.6 oz (82.4 kg)  09/07/16 180 lb (81.6 kg)    Health Maintenance Due  Topic Date Due  . Hepatitis C Screening  Never done  . COVID-19 Vaccine (1) Never done  . HIV Screening  Never done  . TETANUS/TDAP  Never done  . COLONOSCOPY  Never done  . INFLUENZA VACCINE  10/17/2019    There are no preventive care reminders to display for this patient.  CBC Latest Ref Rng & Units 09/07/2016 01/11/2014 01/11/2014  WBC 3.8 - 10.6 K/uL 15.5(H) 12.3(H) 11.7(H)  Hemoglobin 13.0 - 18.0 g/dL 16.2 14.8 15.5  Hematocrit  40 - 52 % 46.4 44.0 48.0  Platelets 150 - 440 K/uL 330 242 272   CMP Latest Ref Rng & Units 09/07/2016 01/11/2014 01/11/2014  Glucose 65 - 99 mg/dL 126(H) 116(H) 117(H)  BUN 6 - 20 mg/dL _1 Creatinine 0.61 - 1.24 mg/dL 1.12 1.06 1.31(H)  Sodium 135 - 145 mmol/L 136 130(L) 132(L)  Potassium 3.5 - 5.1 mmol/L 4.0 3.5 4.5  Chloride 101 - 111 mmol/L 99(L) 93(L) 96(L)  CO2 22 - 32 mmol/L _2 Calcium 8.9 - 10.3 mg/dL 9.8 8.5 9.1  Total Protein 6.5 - 8.1 g/dL 8.0 7.7 8.2  Total Bilirubin 0.3 - 1.2 mg/dL 0.7 0.5 0.5  Alkaline Phos 38 - 126 U/L 85 113 124(H)  AST 15 - 41 U/L 35 27 24  ALT 17 - 63 U/L 48 31 34    Lab Results  Component Value Date   TSH 0.599 01/11/2014   Lab Results  Component Value Date   ALBUMIN 4.5 09/07/2016   ANIONGAP 11 09/07/2016   No results found for: CHOL, HDL, LDLCALC, CHOLHDL No results found for: TRIG No results found for: HGBA1C    ASSESSMENT & PLAN:   Problem List Items Addressed This Visit      Cardiovascular and Mediastinum   Primary hypertension - Primary    HTN fu taking Amlodipine and Toprol as rx, no ha, or cp , SOB. Plan- Starting Lisinopril 10 mg.       Relevant Medications   lisinopril (ZESTRIL) 10 MG tablet   Other Relevant Orders   CMP14+EGFR      Meds ordered this encounter  Medications  . lisinopril (ZESTRIL) 10 MG tablet    Sig: Take 1 tablet (10 mg total) by mouth daily.    Dispense:  90 tablet    Refill:  3      Follow-up: No follow-ups on file.    Beckie Salts, [facility] [address] [place], [place] [address]

## 2020-01-30 LAB — CMP14+EGFR
ALT: 31 IU/L (ref 0–44)
AST: 19 IU/L (ref 0–40)
Albumin/Globulin Ratio: 2.1 (ref 1.2–2.2)
Albumin: 4.8 g/dL (ref 3.8–4.9)
Alkaline Phosphatase: 88 IU/L (ref 44–121)
BUN/Creatinine Ratio: 9 (ref 9–20)
BUN: 10 mg/dL (ref 6–24)
Bilirubin Total: 0.4 mg/dL (ref 0.0–1.2)
CO2: 20 mmol/L (ref 20–29)
Calcium: 9.6 mg/dL (ref 8.7–10.2)
Chloride: 101 mmol/L (ref 96–106)
Creatinine, Ser: 1.1 mg/dL (ref 0.76–1.27)
GFR calc Af Amer: 89 mL/min/{1.73_m2} (ref >59)
GFR calc non Af Amer: 77 mL/min/{1.73_m2} (ref >59)
Globulin, Total: 2.3 g/dL (ref 1.5–4.5)
Glucose: 124 mg/dL — ABNORMAL HIGH (ref 65–99)
Potassium: 3.9 mmol/L (ref 3.5–5.2)
Sodium: 138 mmol/L (ref 134–144)
Total Protein: 7.1 g/dL (ref 6.0–8.5)

## 2020-02-14 ENCOUNTER — Other Ambulatory Visit: Payer: Self-pay | Admitting: *Deleted

## 2020-02-14 MED ORDER — AZITHROMYCIN 250 MG PO TABS: ORAL_TABLET | ORAL | 0 refills | Status: DC

## 2020-02-25 ENCOUNTER — Other Ambulatory Visit: Payer: Self-pay

## 2020-02-25 ENCOUNTER — Ambulatory Visit (INDEPENDENT_AMBULATORY_CARE_PROVIDER_SITE_OTHER): Payer: 59 | Admitting: Family Medicine

## 2020-02-25 ENCOUNTER — Encounter: Payer: Self-pay | Admitting: Family Medicine

## 2020-02-25 VITALS — BP 125/74 | HR 91 | Ht 64.0 in | Wt 184.2 lb

## 2020-02-25 DIAGNOSIS — I10 Essential (primary) hypertension: Secondary | ICD-10-CM

## 2020-02-25 NOTE — Assessment & Plan Note (Addendum)
Pt BP improved after increasing meds. No side effects. Plan- Continue medication, fu 3 months.

## 2020-02-25 NOTE — Progress Notes (Signed)
Established Patient Office Visit  SUBJECTIVE:  Subjective  Patient ID: [name]., male    DOB: 1965/05/30  Age: 54 y.o. MRN: [id number]  CC:  Chief Complaint  Patient presents with  . Hypertension    Patient is here for 1 month BP follow up. Patient has no complaints today besides a migraine which patient states is very common for him.    HPI Charlis Deleon. is a 54 y.o. male presenting today for     Past Medical History:  Diagnosis Date  . Brain tumor (benign) ([place])   . Hypertension     Past Surgical History:  Procedure Laterality Date  . VENTRICULOPERITONEAL SHUNT      History reviewed. No pertinent family history.  Social History   Socioeconomic History  . Marital status: Divorced    Spouse name: Not on file  . Number of children: Not on file  . Years of education: Not on file  . Highest education level: Not on file  Occupational History  . Not on file  Tobacco Use  . Smoking status: Current Some Day Smoker  . Smokeless tobacco: Never Used  Substance and Sexual Activity  . Alcohol use: Yes  . Drug use: Not on file  . Sexual activity: Not on file  Other Topics Concern  . Not on file  Social History Narrative  . Not on file   Social Determinants of Health   Financial Resource Strain: Not on file  Food Insecurity: Not on file  Transportation Needs: Not on file  Physical Activity: Not on file  Stress: Not on file  Social Connections: Not on file  Intimate Partner Violence: Not on file     Current Outpatient Medications:  .  amLODipine (NORVASC) 10 MG tablet, Take 10 mg by mouth 2 (two) times daily., Disp: , Rfl:  .  cyclobenzaprine (FLEXERIL) 10 MG tablet, Take 1 tablet (10 mg total) by mouth 3 (three) times daily as needed for muscle spasms., Disp: 30 tablet, Rfl: 0 .  lisinopril (ZESTRIL) 10 MG tablet, Take 1 tablet (10 mg total) by mouth daily., Disp: 90 tablet, Rfl: 3 .  metoprolol succinate (TOPROL-XL) 25 MG 24 hr tablet, Take  1 tablet (25 mg total) by mouth daily., Disp: 30 tablet, Rfl: 6 .  pantoprazole (PROTONIX) 40 MG tablet, Take 1 tablet (40 mg total) by mouth daily., Disp: 30 tablet, Rfl: 3   Allergies  Allergen Reactions  . Penicillins     ROS Review of Systems  Constitutional: Negative.   HENT: Negative.   Respiratory: Negative.   Cardiovascular: Negative.   Musculoskeletal: Negative.   Neurological: Positive for headaches.  Psychiatric/Behavioral: Negative.      OBJECTIVE:    Physical Exam Constitutional:      Appearance: Normal appearance.  HENT:     Head: Normocephalic.     Nose: Nose normal.     Mouth/Throat:     Mouth: Mucous membranes are moist.  Eyes:     Pupils: Pupils are equal, round, and reactive to light.  Cardiovascular:     Rate and Rhythm: Normal rate and regular rhythm.     Pulses: Normal pulses.  Pulmonary:     Effort: Pulmonary effort is normal.  Musculoskeletal:        General: Normal range of motion.  Neurological:     Mental Status: He is alert.  Psychiatric:        Mood and Affect: Mood normal.     BP  125/74   Pulse 91   Ht 5\' 4"  (1.626 m)   Wt 184 lb 3.2 oz (83.6 kg)   BMI 31.62 kg/m  Wt Readings from Last 3 Encounters:  02/25/20 184 lb 3.2 oz (83.6 kg)  01/28/20 178 lb (80.7 kg)  01/13/20 181 lb 9.6 oz (82.4 kg)    Health Maintenance Due  Topic Date Due  . Hepatitis C Screening  Never done  . COVID-19 Vaccine (1) Never done  . HIV Screening  Never done  . TETANUS/TDAP  Never done  . COLONOSCOPY  Never done  . INFLUENZA VACCINE  10/17/2019    There are no preventive care reminders to display for this patient.  CBC Latest Ref Rng & Units 09/07/2016 01/11/2014 01/11/2014  WBC 3.8 - 10.6 K/uL 15.5(H) 12.3(H) 11.7(H)  Hemoglobin 13.0 - 18.0 g/dL 16.2 14.8 15.5  Hematocrit 40.0 - 52.0 % 46.4 44.0 48.0  Platelets 150 - 440 K/uL 330 242 272   CMP Latest Ref Rng & Units 09/07/2016 01/11/2014 01/11/2014  Glucose 65 - 99 mg/dL 126(H) 116(H)  117(H)  BUN 6 - 20 mg/dL 12 12 14   Creatinine 0.61 - 1.24 mg/dL 1.12 1.06 1.31(H)  Sodium 135 - 145 mmol/L 136 130(L) 132(L)  Potassium 3.5 - 5.1 mmol/L 4.0 3.5 4.5  Chloride 101 - 111 mmol/L 99(L) 93(L) 96(L)  CO2 22 - 32 mmol/L 26 25 28   Calcium 8.9 - 10.3 mg/dL 9.8 8.5 9.1  Total Protein 6.5 - 8.1 g/dL 8.0 7.7 8.2  Total Bilirubin 0.3 - 1.2 mg/dL 0.7 0.5 0.5  Alkaline Phos 38 - 126 U/L 85 113 124(H)  AST 15 - 41 U/L 35 27 24  ALT 17 - 63 U/L 48 31 34    Lab Results  Component Value Date   TSH 0.599 01/11/2014   Lab Results  Component Value Date   ALBUMIN 4.5 09/07/2016   ANIONGAP 11 09/07/2016   No results found for: CHOL, HDL, LDLCALC, CHOLHDL No results found for: TRIG No results found for: HGBA1C    ASSESSMENT & PLAN:   Problem List Items Addressed This Visit      Cardiovascular and Mediastinum   Primary hypertension - Primary    Pt BP improved after increasing meds. No side effects. Plan- Continue medication, fu 3 months.           No orders of the defined types were placed in this encounter.     Follow-up: No follow-ups on file.    Beckie Salts, [facility] [address] [place], [place] [address]

## 2020-03-06 ENCOUNTER — Other Ambulatory Visit: Payer: Self-pay | Admitting: Family Medicine

## 2020-04-11 ENCOUNTER — Other Ambulatory Visit: Payer: Self-pay | Admitting: *Deleted

## 2020-04-11 MED ORDER — METHYLPREDNISOLONE 4 MG PO TBPK: ORAL_TABLET | ORAL | 0 refills | Status: DC

## 2020-04-11 MED ORDER — AZITHROMYCIN 250 MG PO TABS: ORAL_TABLET | ORAL | 0 refills | Status: DC

## 2020-04-25 ENCOUNTER — Other Ambulatory Visit: Payer: Self-pay | Admitting: *Deleted

## 2020-04-25 MED ORDER — AMLODIPINE BESYLATE 10 MG PO TABS: 10.0000 mg | ORAL_TABLET | Freq: Two times a day (BID) | ORAL | 3 refills | Status: DC

## 2020-05-01 ENCOUNTER — Other Ambulatory Visit: Payer: Self-pay | Admitting: *Deleted

## 2020-05-01 MED ORDER — AMLODIPINE BESYLATE 10 MG PO TABS: 10.0000 mg | ORAL_TABLET | Freq: Two times a day (BID) | ORAL | 3 refills | Status: DC

## 2020-05-08 ENCOUNTER — Other Ambulatory Visit: Payer: Self-pay | Admitting: *Deleted

## 2020-05-08 MED ORDER — AZITHROMYCIN 250 MG PO TABS: ORAL_TABLET | ORAL | 0 refills | Status: DC

## 2020-05-26 ENCOUNTER — Other Ambulatory Visit: Payer: Self-pay

## 2020-05-26 ENCOUNTER — Ambulatory Visit (INDEPENDENT_AMBULATORY_CARE_PROVIDER_SITE_OTHER): Payer: 59 | Admitting: Family Medicine

## 2020-05-26 ENCOUNTER — Encounter: Payer: Self-pay | Admitting: Family Medicine

## 2020-05-26 VITALS — BP 153/87 | HR 101 | Ht 64.0 in | Wt 184.2 lb

## 2020-05-26 DIAGNOSIS — I10 Essential (primary) hypertension: Secondary | ICD-10-CM

## 2020-05-26 DIAGNOSIS — Z122 Encounter for screening for malignant neoplasm of respiratory organs: Secondary | ICD-10-CM | POA: Insufficient documentation

## 2020-05-26 NOTE — Progress Notes (Signed)
Established Patient Office Visit  SUBJECTIVE:  Subjective  Patient ID: Tony Preece., male    DOB: May 10, 1965  Age: 55 y.o. MRN: [id number]  CC:  Chief Complaint  Patient presents with  . Hypertension    HPI Tony Forget. is a 55 y.o. male presenting today for     Past Medical History:  Diagnosis Date  . Brain tumor (benign) ([facility] [facility])   . Hypertension     Past Surgical History:  Procedure Laterality Date  . VENTRICULOPERITONEAL SHUNT      History reviewed. No pertinent family history.  Social History   Socioeconomic History  . Marital status: Divorced    Spouse name: Not on file  . Number of children: Not on file  . Years of education: Not on file  . Highest education level: Not on file  Occupational History  . Not on file  Tobacco Use  . Smoking status: Current Some Day Smoker  . Smokeless tobacco: Never Used  Substance and Sexual Activity  . Alcohol use: Yes  . Drug use: Not on file  . Sexual activity: Not on file  Other Topics Concern  . Not on file  Social History Narrative  . Not on file   Social Determinants of Health   Financial Resource Strain: Not on file  Food Insecurity: Not on file  Transportation Needs: Not on file  Physical Activity: Not on file  Stress: Not on file  Social Connections: Not on file  Intimate Partner Violence: Not on file     Current Outpatient Medications:  .  amLODipine (NORVASC) 10 MG tablet, Take 1 tablet (10 mg total) by mouth 2 (two) times daily., Disp: 180 tablet, Rfl: 3 .  azithromycin (ZITHROMAX) 250 MG tablet, As directed, Disp: 6 tablet, Rfl: 0 .  cyclobenzaprine (FLEXERIL) 10 MG tablet, Take 1 tablet (10 mg total) by mouth 3 (three) times daily as needed for muscle spasms., Disp: 30 tablet, Rfl: 0 .  lisinopril (ZESTRIL) 10 MG tablet, Take 1 tablet (10 mg total) by mouth daily., Disp: 90 tablet, Rfl: 3 .  methylPREDNISolone (MEDROL DOSEPAK) 4 MG TBPK tablet, As directed, Disp: 21 each, Rfl: 0 .   metoprolol succinate (TOPROL-XL) 25 MG 24 hr tablet, Take 1 tablet (25 mg total) by mouth daily., Disp: 30 tablet, Rfl: 6 .  pantoprazole (PROTONIX) 40 MG tablet, Take 1 tablet (40 mg total) by mouth daily., Disp: 30 tablet, Rfl: 3   Allergies  Allergen Reactions  . Penicillins     ROS Review of Systems  Constitutional: Negative.   HENT: Negative.   Respiratory: Negative.   Cardiovascular: Negative.   Musculoskeletal: Negative.   Psychiatric/Behavioral: Negative.      OBJECTIVE:    Physical Exam Vitals and nursing note reviewed.  Constitutional:      Appearance: Normal appearance.  HENT:     Mouth/Throat:     Mouth: Mucous membranes are dry.  Cardiovascular:     Rate and Rhythm: Normal rate and regular rhythm.  Pulmonary:     Effort: Pulmonary effort is normal.  Skin:    General: Skin is warm.  Neurological:     General: No focal deficit present.     Mental Status: He is alert.  Psychiatric:        Mood and Affect: Mood normal.     BP (!) 153/87   Pulse (!) 101   Ht 5\' 4"  (1.626 m)   Wt 184 lb 3.2 oz (83.6 kg)  BMI 31.62 kg/m  Wt Readings from Last 3 Encounters:  05/26/20 184 lb 3.2 oz (83.6 kg)  02/25/20 184 lb 3.2 oz (83.6 kg)  01/28/20 178 lb (80.7 kg)    Health Maintenance Due  Topic Date Due  . Hepatitis C Screening  Never done  . COVID-19 Vaccine (1) Never done  . HIV Screening  Never done  . TETANUS/TDAP  Never done  . COLONOSCOPY (Pts 45-67yrs Insurance coverage will need to be confirmed)  Never done  . INFLUENZA VACCINE  10/17/2019    There are no preventive care reminders to display for this patient.  CBC Latest Ref Rng & Units 09/07/2016 01/11/2014 01/11/2014  WBC 3.8 - 10.6 K/uL 15.5(H) 12.3(H) 11.7(H)  Hemoglobin 13.0 - 18.0 g/dL 16.2 14.8 15.5  Hematocrit 40.0 - 52.0 % 46.4 44.0 48.0  Platelets 150 - 440 K/uL 330 242 272   CMP Latest Ref Rng & Units 01/28/2020 09/07/2016 01/11/2014  Glucose 65 - 99 mg/dL 124(H) 126(H) 116(H)  BUN  6 - 24 mg/dL 10 12 12   Creatinine 0.76 - 1.27 mg/dL 1.10 1.12 1.06  Sodium 134 - 144 mmol/L 138 136 130(L)  Potassium 3.5 - 5.2 mmol/L 3.9 4.0 3.5  Chloride 96 - 106 mmol/L 101 99(L) 93(L)  CO2 20 - 29 mmol/L 20 26 25   Calcium 8.7 - 10.2 mg/dL 9.6 9.8 8.5  Total Protein 6.0 - 8.5 g/dL 7.1 8.0 7.7  Total Bilirubin 0.0 - 1.2 mg/dL 0.4 0.7 0.5  Alkaline Phos 44 - 121 IU/L 88 85 113  AST 0 - 40 IU/L 19 35 27  ALT 0 - 44 IU/L 31 48 31    Lab Results  Component Value Date   TSH 0.599 01/11/2014   Lab Results  Component Value Date   ALBUMIN 4.8 01/28/2020   ANIONGAP 11 09/07/2016   No results found for: CHOL, HDL, LDLCALC, CHOLHDL No results found for: TRIG No results found for: HGBA1C    ASSESSMENT & PLAN:   Problem List Items Addressed This Visit      Cardiovascular and Mediastinum   Primary hypertension - Primary    Patient taking all meds for htn, no recent CP or SOB.   Plan- Continue meds, restrict salt.         Other   Screening for lung cancer      No orders of the defined types were placed in this encounter.     Follow-up: No follow-ups on file.    Beckie Salts, [facility] [address] [place], [place] [address]

## 2020-05-26 NOTE — Assessment & Plan Note (Signed)
Patient taking all meds for htn, no recent CP or SOB.   Plan- Continue meds, restrict salt.

## 2020-06-22 ENCOUNTER — Other Ambulatory Visit: Payer: Self-pay | Admitting: Internal Medicine

## 2020-08-07 ENCOUNTER — Other Ambulatory Visit: Payer: Self-pay | Admitting: *Deleted

## 2020-08-07 MED ORDER — PROMETHAZINE HCL 25 MG PO TABS: 25.0000 mg | ORAL_TABLET | Freq: Three times a day (TID) | ORAL | 1 refills | Status: DC

## 2020-08-10 ENCOUNTER — Ambulatory Visit: Payer: 59 | Admitting: Family Medicine

## 2021-01-12 ENCOUNTER — Other Ambulatory Visit: Payer: Self-pay | Admitting: *Deleted

## 2021-01-12 MED ORDER — AZITHROMYCIN 250 MG PO TABS: ORAL_TABLET | ORAL | 0 refills | Status: AC

## 2021-01-22 ENCOUNTER — Other Ambulatory Visit: Payer: Self-pay | Admitting: *Deleted

## 2021-01-22 MED ORDER — METOPROLOL SUCCINATE ER 25 MG PO TB24: 25.0000 mg | ORAL_TABLET | Freq: Every day | ORAL | 3 refills | Status: DC

## 2021-01-22 MED ORDER — LISINOPRIL 10 MG PO TABS: 10.0000 mg | ORAL_TABLET | Freq: Every day | ORAL | 3 refills | Status: DC

## 2021-02-06 ENCOUNTER — Ambulatory Visit: Payer: 59 | Admitting: Internal Medicine

## 2021-02-23 ENCOUNTER — Ambulatory Visit: Payer: 59 | Admitting: Internal Medicine

## 2021-02-26 ENCOUNTER — Other Ambulatory Visit: Payer: Self-pay

## 2021-02-26 MED ORDER — ALBUTEROL SULFATE HFA 108 (90 BASE) MCG/ACT IN AERS: 2.0000 | INHALATION_SPRAY | Freq: Four times a day (QID) | RESPIRATORY_TRACT | 1 refills | Status: DC | PRN

## 2021-03-01 ENCOUNTER — Ambulatory Visit: Payer: 59 | Admitting: Internal Medicine

## 2021-04-02 ENCOUNTER — Ambulatory Visit: Payer: 59 | Admitting: Internal Medicine

## 2021-04-09 ENCOUNTER — Other Ambulatory Visit: Payer: Self-pay | Admitting: *Deleted

## 2021-04-09 ENCOUNTER — Encounter: Payer: Self-pay | Admitting: Internal Medicine

## 2021-04-09 ENCOUNTER — Other Ambulatory Visit: Payer: Self-pay

## 2021-04-09 ENCOUNTER — Ambulatory Visit
Admission: RE | Admit: 2021-04-09 | Discharge: 2021-04-09 | Disposition: A | Discharge status: Home / Self Care | Payer: 59 | Attending: Internal Medicine | Admitting: Internal Medicine

## 2021-04-09 ENCOUNTER — Ambulatory Visit (INDEPENDENT_AMBULATORY_CARE_PROVIDER_SITE_OTHER): Payer: 59 | Admitting: Internal Medicine

## 2021-04-09 VITALS — BP 140/78 | HR 97 | Ht 64.0 in | Wt 186.7 lb

## 2021-04-09 DIAGNOSIS — Z122 Encounter for screening for malignant neoplasm of respiratory organs: Secondary | ICD-10-CM | POA: Diagnosis not present

## 2021-04-09 DIAGNOSIS — J069 Acute upper respiratory infection, unspecified: Secondary | ICD-10-CM | POA: Diagnosis not present

## 2021-04-09 DIAGNOSIS — Z20822 Contact with and (suspected) exposure to covid-19: Secondary | ICD-10-CM | POA: Diagnosis not present

## 2021-04-09 DIAGNOSIS — I10 Essential (primary) hypertension: Secondary | ICD-10-CM

## 2021-04-09 LAB — CBC WITH DIFFERENTIAL/PLATELET
Absolute Monocytes: 612 cells/uL (ref 200–950)
Basophils Absolute: 61 cells/uL (ref 0–200)
Basophils Relative: 1.2 %
Eosinophils Absolute: 148 cells/uL (ref 15–500)
Eosinophils Relative: 2.9 %
HCT: 45.8 % (ref 38.5–50.0)
Hemoglobin: 15.3 g/dL (ref 13.2–17.1)
Lymphs Abs: 1668 cells/uL (ref 850–3900)
MCH: 29.4 pg (ref 27.0–33.0)
MCHC: 33.4 g/dL (ref 32.0–36.0)
MCV: 88.1 fL (ref 80.0–100.0)
MPV: 9.4 fL (ref 7.5–12.5)
Monocytes Relative: 12 %
Neutro Abs: 2611 cells/uL (ref 1500–7800)
Neutrophils Relative %: 51.2 %
Platelets: 271 10*3/uL (ref 140–400)
RBC: 5.2 10*6/uL (ref 4.20–5.80)
RDW: 13.1 % (ref 11.0–15.0)
Total Lymphocyte: 32.7 %
WBC: 5.1 10*3/uL (ref 3.8–10.8)

## 2021-04-09 LAB — POC COVID19 BINAXNOW: SARS Coronavirus 2 Ag: NEGATIVE

## 2021-04-09 MED ORDER — AZITHROMYCIN 250 MG PO TABS: ORAL_TABLET | ORAL | 0 refills | Status: DC

## 2021-04-09 NOTE — Progress Notes (Signed)
Established Patient Office Visit  Subjective:  Patient ID: [name]., male    DOB: 11/17/1965  Age: 56 y.o. MRN: [id number]  CC:  Chief Complaint  Patient presents with   Sinusitis    Cold symptoms started Friday, pt complains of cough, congestion, sore throat, shortness of breath.    Sinusitis Associated symptoms include chills and congestion.   Tony C Brandau Jr. presents for uri  Past Medical History:  Diagnosis Date   Brain tumor (benign) ([place])    Hypertension     Past Surgical History:  Procedure Laterality Date   VENTRICULOPERITONEAL SHUNT      History reviewed. No pertinent family history.  Social History   Socioeconomic History   Marital status: Divorced    Spouse name: Not on file   Number of children: Not on file   Years of education: Not on file   Highest education level: Not on file  Occupational History   Not on file  Tobacco Use   Smoking status: Some Days   Smokeless tobacco: Never  Substance and Sexual Activity   Alcohol use: Yes   Drug use: Not on file   Sexual activity: Not on file  Other Topics Concern   Not on file  Social History Narrative   Not on file   Social Determinants of Health   Financial Resource Strain: Not on file  Food Insecurity: Not on file  Transportation Needs: Not on file  Physical Activity: Not on file  Stress: Not on file  Social Connections: Not on file  Intimate Partner Violence: Not on file     Current Outpatient Medications:    albuterol (VENTOLIN HFA) 108 (90 Base) MCG/ACT inhaler, Inhale 2 puffs into the lungs every 6 (six) hours as needed for wheezing or shortness of breath., Disp: 8 g, Rfl: 1   amLODipine (NORVASC) 10 MG tablet, Take 1 tablet (10 mg total) by mouth 2 (two) times daily., Disp: 180 tablet, Rfl: 3   azithromycin (ZITHROMAX) 250 MG tablet, As directed, Disp: 6 tablet, Rfl: 0   cyclobenzaprine (FLEXERIL) 10 MG tablet, Take 1 tablet (10 mg total) by mouth 3 (three) times daily  as needed for muscle spasms., Disp: 30 tablet, Rfl: 0   lisinopril (ZESTRIL) 10 MG tablet, Take 1 tablet (10 mg total) by mouth daily., Disp: 90 tablet, Rfl: 3   methylPREDNISolone (MEDROL DOSEPAK) 4 MG TBPK tablet, As directed, Disp: 21 each, Rfl: 0   metoprolol succinate (TOPROL-XL) 25 MG 24 hr tablet, Take 1 tablet (25 mg total) by mouth daily., Disp: 90 tablet, Rfl: 3   pantoprazole (PROTONIX) 40 MG tablet, Take 1 tablet (40 mg total) by mouth daily., Disp: 30 tablet, Rfl: 3   promethazine (PHENERGAN) 25 MG tablet, Take 1 tablet (25 mg total) by mouth 3 (three) times daily., Disp: 90 tablet, Rfl: 1   Allergies  Allergen Reactions   Penicillins     ROS Review of Systems  Constitutional:  Positive for chills and fatigue. Negative for fever.  HENT:  Positive for congestion, postnasal drip and rhinorrhea. Negative for facial swelling and nosebleeds.   Eyes: Negative.   Respiratory: Negative.    Cardiovascular: Negative.   Gastrointestinal: Negative.   Endocrine: Negative.   Genitourinary: Negative.   Musculoskeletal: Negative.   Skin: Negative.   Allergic/Immunologic: Negative.   Neurological: Negative.   Hematological: Negative.   Psychiatric/Behavioral: Negative.    All other systems reviewed and are negative.    Objective:  Physical Exam Vitals reviewed.  Constitutional:      Appearance: Normal appearance.  HENT:     Mouth/Throat:     Mouth: Mucous membranes are moist.  Eyes:     Pupils: Pupils are equal, round, and reactive to light.  Neck:     Vascular: No carotid bruit.  Cardiovascular:     Rate and Rhythm: Normal rate and regular rhythm.     Pulses: Normal pulses.     Heart sounds: Normal heart sounds.  Pulmonary:     Effort: Pulmonary effort is normal.     Breath sounds: Normal breath sounds.  Abdominal:     General: Bowel sounds are normal.     Palpations: Abdomen is soft. There is no hepatomegaly, splenomegaly or mass.     Tenderness: There is no  abdominal tenderness.     Hernia: No hernia is present.  Musculoskeletal:     Cervical back: Neck supple.     Right lower leg: No edema.     Left lower leg: No edema.  Skin:    Findings: No rash.  Neurological:     Mental Status: He is alert and oriented to person, place, and time.     Motor: No weakness.  Psychiatric:        Mood and Affect: Mood normal.        Behavior: Behavior normal.    BP 140/78    Pulse 97    Ht 5\' 4"  (1.626 m)    Wt 186 lb 11.2 oz (84.7 kg)    BMI 32.05 kg/m  Wt Readings from Last 3 Encounters:  04/09/21 186 lb 11.2 oz (84.7 kg)  05/26/20 184 lb 3.2 oz (83.6 kg)  02/25/20 184 lb 3.2 oz (83.6 kg)     Health Maintenance Due  Topic Date Due   COVID-19 Vaccine (1) Never done   HIV Screening  Never done   Hepatitis C Screening  Never done   TETANUS/TDAP  Never done   COLONOSCOPY (Pts 45-14yrs Insurance coverage will need to be confirmed)  Never done   Zoster Vaccines- Shingrix (1 of 2) Never done   INFLUENZA VACCINE  10/16/2020    There are no preventive care reminders to display for this patient.  Lab Results  Component Value Date   TSH 0.599 01/11/2014   Lab Results  Component Value Date   WBC 15.5 (H) 09/07/2016   HGB 16.2 09/07/2016   HCT 46.4 09/07/2016   MCV 88.8 09/07/2016   PLT 330 09/07/2016   Lab Results  Component Value Date   NA 138 01/28/2020   K 3.9 01/28/2020   CO2 20 01/28/2020   GLUCOSE 124 (H) 01/28/2020   BUN 10 01/28/2020   CREATININE 1.10 01/28/2020   BILITOT 0.4 01/28/2020   ALKPHOS 88 01/28/2020   AST 19 01/28/2020   ALT 31 01/28/2020   PROT 7.1 01/28/2020   ALBUMIN 4.8 01/28/2020   CALCIUM 9.6 01/28/2020   ANIONGAP 11 09/07/2016   No results found for: CHOL No results found for: HDL No results found for: LDLCALC No results found for: TRIG No results found for: CHOLHDL No results found for: HGBA1C    Assessment & Plan:   Problem List Items Addressed This Visit       Cardiovascular and  Mediastinum   Primary hypertension     Patient denies any chest pain or shortness of breath there is no history of palpitation or paroxysmal nocturnal dyspnea   patient was advised to follow  low-salt low-cholesterol diet    ideally I want to keep systolic blood pressure below 130 mmHg, patient was asked to check blood pressure one times a week and give me a report on that.  Patient will be follow-up in 3 months  or earlier as needed, patient will call me back for any change in the cardiovascular symptoms Patient was advised to buy a book from local bookstore concerning blood pressure and read several chapters  every day.  This will be supplemented by some of the material we will give him from the office.  Patient should also utilize other resources like YouTube and Internet to learn more about the blood pressure and the diet.        Respiratory   URI, acute    Patient is negative for COVID we will start the patient on Z-Pak as directed        Other   Screening for lung cancer    We will get a chest x-ray      Suspected COVID-19 virus infection - Primary    Patient is negative [id number]      Relevant Orders   POC COVID-19 (Completed)    No orders of the defined types were placed in this encounter. Patient complaining of having recurrent cold, he was exposed to epoxymaterial, we will get a chest x-ray start the patient on Z-Pak also get a CBC done, is negative [id number]  Follow-up: No follow-ups on file.    Cletis Athens, MD

## 2021-04-09 NOTE — Assessment & Plan Note (Signed)
We will get a chest x-ray

## 2021-04-09 NOTE — Assessment & Plan Note (Signed)

## 2021-04-09 NOTE — Assessment & Plan Note (Signed)
Patient is negative for covid.

## 2021-04-09 NOTE — Addendum Note (Signed)
Addended by: Lacretia Nicks L on: 04/09/2021 11:05 AM   Modules accepted: Orders

## 2021-04-09 NOTE — Assessment & Plan Note (Signed)
Patient is negative for COVID we will start the patient on Z-Pak as directed

## 2021-04-10 ENCOUNTER — Ambulatory Visit
Admission: RE | Admit: 2021-04-10 | Discharge: 2021-04-10 | Disposition: A | Discharge status: Home / Self Care | Payer: 59 | Attending: Cardiology | Admitting: Cardiology

## 2021-04-10 ENCOUNTER — Ambulatory Visit
Admission: RE | Admit: 2021-04-10 | Discharge: 2021-04-10 | Disposition: A | Discharge status: Home / Self Care | Payer: 59 | Source: Ambulatory Visit | Attending: Internal Medicine | Admitting: Internal Medicine

## 2021-04-10 DIAGNOSIS — J069 Acute upper respiratory infection, unspecified: Secondary | ICD-10-CM | POA: Diagnosis present

## 2021-04-11 ENCOUNTER — Other Ambulatory Visit: Payer: Self-pay

## 2021-04-11 ENCOUNTER — Encounter: Payer: Self-pay | Admitting: Internal Medicine

## 2021-04-11 ENCOUNTER — Ambulatory Visit (INDEPENDENT_AMBULATORY_CARE_PROVIDER_SITE_OTHER): Payer: 59 | Admitting: Internal Medicine

## 2021-04-11 ENCOUNTER — Ambulatory Visit: Payer: 59 | Admitting: Internal Medicine

## 2021-04-11 VITALS — BP 140/107 | HR 88 | Wt 186.0 lb

## 2021-04-11 DIAGNOSIS — J069 Acute upper respiratory infection, unspecified: Secondary | ICD-10-CM

## 2021-04-11 DIAGNOSIS — I10 Essential (primary) hypertension: Secondary | ICD-10-CM | POA: Diagnosis not present

## 2021-04-11 DIAGNOSIS — Z122 Encounter for screening for malignant neoplasm of respiratory organs: Secondary | ICD-10-CM

## 2021-04-11 DIAGNOSIS — Z20822 Contact with and (suspected) exposure to covid-19: Secondary | ICD-10-CM | POA: Diagnosis not present

## 2021-04-11 MED ORDER — METHYLPREDNISOLONE 4 MG PO TBPK: ORAL_TABLET | ORAL | 1 refills | Status: DC

## 2021-04-11 NOTE — Assessment & Plan Note (Addendum)
Patient will continue zpack , We will add Medrol Dosepak

## 2021-04-11 NOTE — Progress Notes (Signed)
Established Patient Office Visit  Subjective:  Patient ID: Tony Hettinger., male    DOB: 01-25-1966  Age: 56 y.o. MRN: [id number]  CC:  Chief Complaint  Patient presents with   URI    URI    Tony C Coote Jr. presents for wheezing  and sob   Past Medical History:  Diagnosis Date   Brain tumor (benign) ([place])    Hypertension     Past Surgical History:  Procedure Laterality Date   VENTRICULOPERITONEAL SHUNT      History reviewed. No pertinent family history.  Social History   Socioeconomic History   Marital status: Divorced    Spouse name: Not on file   Number of children: Not on file   Years of education: Not on file   Highest education level: Not on file  Occupational History   Not on file  Tobacco Use   Smoking status: Some Days   Smokeless tobacco: Never  Substance and Sexual Activity   Alcohol use: Yes   Drug use: Not on file   Sexual activity: Not on file  Other Topics Concern   Not on file  Social History Narrative   Not on file   Social Determinants of Health   Financial Resource Strain: Not on file  Food Insecurity: Not on file  Transportation Needs: Not on file  Physical Activity: Not on file  Stress: Not on file  Social Connections: Not on file  Intimate Partner Violence: Not on file     Current Outpatient Medications:    methylPREDNISolone (MEDROL DOSEPAK) 4 MG TBPK tablet, Medrol Dosepak as directed, Disp: 21 tablet, Rfl: 1   albuterol (VENTOLIN HFA) 108 (90 Base) MCG/ACT inhaler, Inhale 2 puffs into the lungs every 6 (six) hours as needed for wheezing or shortness of breath., Disp: 8 g, Rfl: 1   amLODipine (NORVASC) 10 MG tablet, Take 1 tablet (10 mg total) by mouth 2 (two) times daily., Disp: 180 tablet, Rfl: 3   azithromycin (ZITHROMAX) 250 MG tablet, As directed, Disp: 6 tablet, Rfl: 0   cyclobenzaprine (FLEXERIL) 10 MG tablet, Take 1 tablet (10 mg total) by mouth 3 (three) times daily as needed for muscle spasms., Disp: 30  tablet, Rfl: 0   lisinopril (ZESTRIL) 10 MG tablet, Take 1 tablet (10 mg total) by mouth daily., Disp: 90 tablet, Rfl: 3   methylPREDNISolone (MEDROL DOSEPAK) 4 MG TBPK tablet, As directed, Disp: 21 each, Rfl: 0   metoprolol succinate (TOPROL-XL) 25 MG 24 hr tablet, Take 1 tablet (25 mg total) by mouth daily., Disp: 90 tablet, Rfl: 3   pantoprazole (PROTONIX) 40 MG tablet, Take 1 tablet (40 mg total) by mouth daily., Disp: 30 tablet, Rfl: 3   promethazine (PHENERGAN) 25 MG tablet, Take 1 tablet (25 mg total) by mouth 3 (three) times daily., Disp: 90 tablet, Rfl: 1   Allergies  Allergen Reactions   Penicillins     ROS Review of Systems  Constitutional: Negative.   HENT: Negative.    Eyes: Negative.   Respiratory: Negative.    Cardiovascular: Negative.   Gastrointestinal: Negative.   Endocrine: Negative.   Genitourinary: Negative.   Musculoskeletal: Negative.   Skin: Negative.   Allergic/Immunologic: Negative.   Neurological: Negative.   Hematological: Negative.   Psychiatric/Behavioral: Negative.    All other systems reviewed and are negative.    Objective:    Physical Exam Vitals reviewed.  Constitutional:      Appearance: Normal appearance.  HENT:  Mouth/Throat:     Mouth: Mucous membranes are moist.  Eyes:     Pupils: Pupils are equal, round, and reactive to light.  Neck:     Vascular: No carotid bruit.  Cardiovascular:     Rate and Rhythm: Normal rate and regular rhythm.     Pulses: Normal pulses.     Heart sounds: Normal heart sounds.  Pulmonary:     Effort: Pulmonary effort is normal.     Breath sounds: Wheezing present.  Abdominal:     General: Bowel sounds are normal.     Palpations: Abdomen is soft. There is no hepatomegaly, splenomegaly or mass.     Tenderness: There is no abdominal tenderness.     Hernia: No hernia is present.  Musculoskeletal:     Cervical back: Neck supple.     Right lower leg: No edema.     Left lower leg: No edema.  Skin:     Findings: No rash.  Neurological:     Mental Status: He is alert and oriented to person, place, and time.     Motor: No weakness.  Psychiatric:        Mood and Affect: Mood normal.        Behavior: Behavior normal.    BP (!) 140/107    Pulse 88    Wt 186 lb (84.4 kg)    SpO2 97%    BMI 31.93 kg/m  Wt Readings from Last 3 Encounters:  04/11/21 186 lb (84.4 kg)  04/09/21 186 lb 11.2 oz (84.7 kg)  05/26/20 184 lb 3.2 oz (83.6 kg)     Health Maintenance Due  Topic Date Due   COVID-19 Vaccine (1) Never done   HIV Screening  Never done   Hepatitis C Screening  Never done   TETANUS/TDAP  Never done   COLONOSCOPY (Pts 45-7yrs Insurance coverage will need to be confirmed)  Never done   Zoster Vaccines- Shingrix (1 of 2) Never done   INFLUENZA VACCINE  10/16/2020    There are no preventive care reminders to display for this patient.  Lab Results  Component Value Date   TSH 0.599 01/11/2014   Lab Results  Component Value Date   WBC 5.1 04/09/2021   HGB 15.3 04/09/2021   HCT 45.8 04/09/2021   MCV 88.1 04/09/2021   PLT 271 04/09/2021   Lab Results  Component Value Date   NA 138 01/28/2020   K 3.9 01/28/2020   CO2 20 01/28/2020   GLUCOSE 124 (H) 01/28/2020   BUN 10 01/28/2020   CREATININE 1.10 01/28/2020   BILITOT 0.4 01/28/2020   ALKPHOS 88 01/28/2020   AST 19 01/28/2020   ALT 31 01/28/2020   PROT 7.1 01/28/2020   ALBUMIN 4.8 01/28/2020   CALCIUM 9.6 01/28/2020   ANIONGAP 11 09/07/2016   No results found for: CHOL No results found for: HDL No results found for: LDLCALC No results found for: TRIG No results found for: CHOLHDL No results found for: HGBA1C    Assessment & Plan:   Problem List Items Addressed This Visit       Cardiovascular and Mediastinum   Primary hypertension - Primary    Blood pressure is stable        Respiratory   URI, acute    Patient will continue zpack , We will add Medrol Dosepak          Other   Screening for lung  cancer    X-ray does not show any  congestive heart failure or pneumonia      Suspected COVID-19 virus infection    Patient has been tested negative for COVID       Meds ordered this encounter  Medications   methylPREDNISolone (MEDROL DOSEPAK) 4 MG TBPK tablet    Sig: Medrol Dosepak as directed    Dispense:  21 tablet    Refill:  1    Follow-up: No follow-ups on file.    Cletis Athens, MD

## 2021-04-11 NOTE — Assessment & Plan Note (Signed)
X-ray does not show any congestive heart failure or pneumonia

## 2021-04-11 NOTE — Assessment & Plan Note (Signed)
Patient has been tested negative for COVID

## 2021-04-11 NOTE — Assessment & Plan Note (Signed)
Blood pressure is stable 

## 2021-04-17 ENCOUNTER — Ambulatory Visit (INDEPENDENT_AMBULATORY_CARE_PROVIDER_SITE_OTHER): Payer: 59 | Admitting: Internal Medicine

## 2021-04-17 ENCOUNTER — Encounter: Payer: Self-pay | Admitting: Internal Medicine

## 2021-04-17 ENCOUNTER — Other Ambulatory Visit: Payer: Self-pay

## 2021-04-17 VITALS — BP 154/78 | HR 95

## 2021-04-17 DIAGNOSIS — E669 Obesity, unspecified: Secondary | ICD-10-CM

## 2021-04-17 DIAGNOSIS — I10 Essential (primary) hypertension: Secondary | ICD-10-CM

## 2021-04-17 DIAGNOSIS — J069 Acute upper respiratory infection, unspecified: Secondary | ICD-10-CM | POA: Diagnosis not present

## 2021-04-17 MED ORDER — METHYLPREDNISOLONE 4 MG PO TBPK
ORAL_TABLET | ORAL | 0 refills | Status: DC
Start: 2021-04-17 — End: 2021-07-30

## 2021-04-17 NOTE — Assessment & Plan Note (Signed)

## 2021-04-17 NOTE — Assessment & Plan Note (Signed)

## 2021-04-17 NOTE — Assessment & Plan Note (Signed)
Patient is still wheezing in the chest

## 2021-04-17 NOTE — Progress Notes (Signed)
Established Patient Office Visit  Subjective:  Patient ID: [name]., male    DOB: 1965-07-04  Age: 56 y.o. MRN: [id number]  CC:  Chief Complaint  Patient presents with   URI    Patient here for 1 week follow up    URI    [name] presents for   Check up Past Medical History:  Diagnosis Date   Brain tumor (benign) ([place])    Hypertension     Past Surgical History:  Procedure Laterality Date   VENTRICULOPERITONEAL SHUNT      History reviewed. No pertinent family history.  Social History   Socioeconomic History   Marital status: Divorced    Spouse name: Not on file   Number of children: Not on file   Years of education: Not on file   Highest education level: Not on file  Occupational History   Not on file  Tobacco Use   Smoking status: Some Days   Smokeless tobacco: Never  Substance and Sexual Activity   Alcohol use: Yes   Drug use: Not on file   Sexual activity: Not on file  Other Topics Concern   Not on file  Social History Narrative   Not on file   Social Determinants of Health   Financial Resource Strain: Not on file  Food Insecurity: Not on file  Transportation Needs: Not on file  Physical Activity: Not on file  Stress: Not on file  Social Connections: Not on file  Intimate Partner Violence: Not on file     Current Outpatient Medications:    albuterol (VENTOLIN HFA) 108 (90 Base) MCG/ACT inhaler, Inhale 2 puffs into the lungs every 6 (six) hours as needed for wheezing or shortness of breath., Disp: 8 g, Rfl: 1   amLODipine (NORVASC) 10 MG tablet, Take 1 tablet (10 mg total) by mouth 2 (two) times daily., Disp: 180 tablet, Rfl: 3   azithromycin (ZITHROMAX) 250 MG tablet, As directed, Disp: 6 tablet, Rfl: 0   cyclobenzaprine (FLEXERIL) 10 MG tablet, Take 1 tablet (10 mg total) by mouth 3 (three) times daily as needed for muscle spasms., Disp: 30 tablet, Rfl: 0   lisinopril (ZESTRIL) 10 MG tablet, Take 1 tablet (10 mg total)  by mouth daily., Disp: 90 tablet, Rfl: 3   methylPREDNISolone (MEDROL DOSEPAK) 4 MG TBPK tablet, As directed, Disp: 21 each, Rfl: 0   methylPREDNISolone (MEDROL DOSEPAK) 4 MG TBPK tablet, Medrol Dosepak as directed, Disp: 21 tablet, Rfl: 1   metoprolol succinate (TOPROL-XL) 25 MG 24 hr tablet, Take 1 tablet (25 mg total) by mouth daily., Disp: 90 tablet, Rfl: 3   pantoprazole (PROTONIX) 40 MG tablet, Take 1 tablet (40 mg total) by mouth daily., Disp: 30 tablet, Rfl: 3   promethazine (PHENERGAN) 25 MG tablet, Take 1 tablet (25 mg total) by mouth 3 (three) times daily., Disp: 90 tablet, Rfl: 1   Allergies  Allergen Reactions   Penicillins     ROS Review of Systems  Constitutional: Negative.   HENT: Negative.    Eyes: Negative.   Respiratory: Negative.    Cardiovascular: Negative.   Gastrointestinal: Negative.   Endocrine: Negative.   Genitourinary: Negative.   Musculoskeletal: Negative.   Skin: Negative.   Allergic/Immunologic: Negative.   Neurological: Negative.   Hematological: Negative.   Psychiatric/Behavioral: Negative.    All other systems reviewed and are negative.    Objective:    Physical Exam Vitals reviewed.  Constitutional:  Appearance: Normal appearance.  HENT:     Mouth/Throat:     Mouth: Mucous membranes are moist.  Eyes:     Pupils: Pupils are equal, round, and reactive to light.  Neck:     Vascular: No carotid bruit.  Cardiovascular:     Rate and Rhythm: Normal rate and regular rhythm.     Pulses: Normal pulses.     Heart sounds: Normal heart sounds.  Pulmonary:     Effort: Pulmonary effort is normal.     Breath sounds: Normal breath sounds.  Abdominal:     General: Bowel sounds are normal.     Palpations: Abdomen is soft. There is no hepatomegaly, splenomegaly or mass.     Tenderness: There is no abdominal tenderness.     Hernia: No hernia is present.  Musculoskeletal:     Cervical back: Neck supple.     Right lower leg: No edema.      Left lower leg: No edema.  Skin:    Findings: No rash.  Neurological:     Mental Status: He is alert and oriented to person, place, and time.     Motor: No weakness.  Psychiatric:        Mood and Affect: Mood normal.        Behavior: Behavior normal.    BP (!) 154/78    Pulse 95  Wt Readings from Last 3 Encounters:  04/11/21 186 lb (84.4 kg)  04/09/21 186 lb 11.2 oz (84.7 kg)  05/26/20 184 lb 3.2 oz (83.6 kg)     Health Maintenance Due  Topic Date Due   COVID-19 Vaccine (1) Never done   HIV Screening  Never done   Hepatitis C Screening  Never done   TETANUS/TDAP  Never done   COLONOSCOPY (Pts 45-62yrs Insurance coverage will need to be confirmed)  Never done   Zoster Vaccines- Shingrix (1 of 2) Never done   INFLUENZA VACCINE  10/16/2020    There are no preventive care reminders to display for this patient.  Lab Results  Component Value Date   TSH 0.599 01/11/2014   Lab Results  Component Value Date   WBC 5.1 04/09/2021   HGB 15.3 04/09/2021   HCT 45.8 04/09/2021   MCV 88.1 04/09/2021   PLT 271 04/09/2021   Lab Results  Component Value Date   NA 138 01/28/2020   K 3.9 01/28/2020   CO2 20 01/28/2020   GLUCOSE 124 (H) 01/28/2020   BUN 10 01/28/2020   CREATININE 1.10 01/28/2020   BILITOT 0.4 01/28/2020   ALKPHOS 88 01/28/2020   AST 19 01/28/2020   ALT 31 01/28/2020   PROT 7.1 01/28/2020   ALBUMIN 4.8 01/28/2020   CALCIUM 9.6 01/28/2020   ANIONGAP 11 09/07/2016   No results found for: CHOL No results found for: HDL No results found for: LDLCALC No results found for: TRIG No results found for: CHOLHDL No results found for: HGBA1C    Assessment & Plan:   Problem List Items Addressed This Visit       Cardiovascular and Mediastinum   Primary hypertension - Primary     Patient denies any chest pain or shortness of breath there is no history of palpitation or paroxysmal nocturnal dyspnea   patient was advised to follow low-salt low-cholesterol  diet    ideally I want to keep systolic blood pressure below 130 mmHg, patient was asked to check blood pressure one times a week and give me a report on that.  Patient will be follow-up in 3 months  or earlier as needed, patient will call me back for any change in the cardiovascular symptoms Patient was advised to buy a book from local bookstore concerning blood pressure and read several chapters  every day.  This will be supplemented by some of the material we will give him from the office.  Patient should also utilize other resources like YouTube and Internet to learn more about the blood pressure and the diet.        Respiratory   URI, acute    Patient is still wheezing in the chest        Other   Obesity (BMI 30.0-34.9)    - I encouraged the patient to lose weight.  - I educated them on making healthy dietary choices including eating more fruits and vegetables and less fried foods. - I encouraged the patient to exercise more, and educated on the benefits of exercise including weight loss, diabetes prevention, and hypertension prevention.   Dietary counseling with a registered dietician  Referral to a weight management support group (e.g. Weight Watchers, Overeaters Anonymous)  If your BMI is greater than 29 or you have gained more than 15 pounds you should work on weight loss.  Attend a healthy cooking class        No orders of the defined types were placed in this encounter.   Follow-up: No follow-ups on file.    Cletis Athens, MD

## 2021-07-19 ENCOUNTER — Other Ambulatory Visit: Payer: Self-pay | Admitting: *Deleted

## 2021-07-19 MED ORDER — METHYLPREDNISOLONE 4 MG PO TBPK: ORAL_TABLET | ORAL | 0 refills | Status: DC

## 2021-07-30 ENCOUNTER — Encounter: Payer: Self-pay | Admitting: Internal Medicine

## 2021-07-30 ENCOUNTER — Ambulatory Visit (INDEPENDENT_AMBULATORY_CARE_PROVIDER_SITE_OTHER): Payer: Commercial Managed Care - PPO | Admitting: Internal Medicine

## 2021-07-30 VITALS — BP 133/85 | HR 92 | Ht 64.0 in | Wt 180.0 lb

## 2021-07-30 DIAGNOSIS — M5440 Lumbago with sciatica, unspecified side: Secondary | ICD-10-CM

## 2021-07-30 DIAGNOSIS — I10 Essential (primary) hypertension: Secondary | ICD-10-CM | POA: Diagnosis not present

## 2021-07-30 DIAGNOSIS — E669 Obesity, unspecified: Secondary | ICD-10-CM

## 2021-07-30 MED ORDER — CYCLOBENZAPRINE HCL 10 MG PO TABS: 10.0000 mg | ORAL_TABLET | Freq: Three times a day (TID) | ORAL | 0 refills | Status: DC | PRN

## 2021-07-30 MED ORDER — METHYLPREDNISOLONE 4 MG PO TBPK: ORAL_TABLET | ORAL | 0 refills | Status: DC

## 2021-07-30 NOTE — Assessment & Plan Note (Signed)
-   Patient's back pain is under control with medication.  - Encouraged the patient to stretch or do yoga as able to help with back pain 

## 2021-07-30 NOTE — Assessment & Plan Note (Signed)

## 2021-07-30 NOTE — Progress Notes (Signed)
? ?Established Patient Office Visit ? ?Subjective:  ?Patient ID: Tony Evett., male    DOB: 08-20-1965  Age: 56 y.o. MRN: [id number] ? ?CC:  ?Chief Complaint  ?Patient presents with  ? Back Pain  ?  Patient seeing chiropractor for the pain and is starting message therapy   ? ? ?Back Pain ?This is a new problem. The current episode started in the past 7 days. The problem is unchanged. The pain is present in the sacro-iliac. The quality of the pain is described as cramping. The pain is at a severity of 5/10. The pain is moderate. The pain is Worse during the day. The symptoms are aggravated by lying down and twisting.  ? ?[name]. presents for back pain ? ? ?Past Surgical History:  ?Procedure Laterality Date  ? VENTRICULOPERITONEAL SHUNT    ? ? ?No family history on file. ? ?Social History  ? ?Socioeconomic History  ? Marital status: Divorced  ?  Spouse name: Not on file  ? Number of children: Not on file  ? Years of education: Not on file  ? Highest education level: Not on file  ?Occupational History  ? Not on file  ?Tobacco Use  ? Smoking status: Some Days  ? Smokeless tobacco: Never  ?Substance and Sexual Activity  ? Alcohol use: Yes  ? Drug use: Not on file  ? Sexual activity: Not on file  ?Other Topics Concern  ? Not on file  ?Social History Narrative  ? Not on file  ? ?Social Determinants of Health  ? ?Financial Resource Strain: Not on file  ?Food Insecurity: Not on file  ?Transportation Needs: Not on file  ?Physical Activity: Not on file  ?Stress: Not on file  ?Social Connections: Not on file  ?Intimate Partner Violence: Not on file  ? ? ? ?Current Outpatient Medications:  ?  albuterol (VENTOLIN HFA) 108 (90 Base) MCG/ACT inhaler, Inhale 2 puffs into the lungs every 6 (six) hours as needed for wheezing or shortness of breath., Disp: 8 g, Rfl: 1 ?  amLODipine (NORVASC) 10 MG tablet, Take 1 tablet (10 mg total) by mouth 2 (two) times daily., Disp: 180 tablet, Rfl: 3 ?  azithromycin (ZITHROMAX)  250 MG tablet, As directed, Disp: 6 tablet, Rfl: 0 ?  cyclobenzaprine (FLEXERIL) 10 MG tablet, Take 1 tablet (10 mg total) by mouth 3 (three) times daily as needed for muscle spasms., Disp: 30 tablet, Rfl: 0 ?  lisinopril (ZESTRIL) 10 MG tablet, Take 1 tablet (10 mg total) by mouth daily., Disp: 90 tablet, Rfl: 3 ?  methylPREDNISolone (MEDROL DOSEPAK) 4 MG TBPK tablet, As directed, Disp: 21 each, Rfl: 0 ?  methylPREDNISolone (MEDROL DOSEPAK) 4 MG TBPK tablet, Medrol Dosepak as directed, Disp: 21 tablet, Rfl: 1 ?  methylPREDNISolone (MEDROL DOSEPAK) 4 MG TBPK tablet, Medrol Dosepak as directed, Disp: 21 tablet, Rfl: 0 ?  methylPREDNISolone (MEDROL DOSEPAK) 4 MG TBPK tablet, As directed, Disp: 21 each, Rfl: 0 ?  metoprolol succinate (TOPROL-XL) 25 MG 24 hr tablet, Take 1 tablet (25 mg total) by mouth daily., Disp: 90 tablet, Rfl: 3 ?  pantoprazole (PROTONIX) 40 MG tablet, Take 1 tablet (40 mg total) by mouth daily., Disp: 30 tablet, Rfl: 3 ?  promethazine (PHENERGAN) 25 MG tablet, Take 1 tablet (25 mg total) by mouth 3 (three) times daily., Disp: 90 tablet, Rfl: 1  ? ?Allergies  ?Allergen Reactions  ? Penicillins   ? ? ?ROS ?Review of Systems  ?Constitutional: Negative.   ?  HENT: Negative.    ?Eyes: Negative.   ?Respiratory: Negative.    ?Cardiovascular: Negative.   ?Gastrointestinal: Negative.   ?Endocrine: Negative.   ?Genitourinary: Negative.   ?Musculoskeletal:  Positive for arthralgias and back pain.  ?Skin: Negative.   ?Allergic/Immunologic: Negative.   ?Neurological: Negative.   ?Hematological: Negative.   ?Psychiatric/Behavioral: Negative.    ?All other systems reviewed and are negative. ? ?  ?Objective:  ?  ?Physical Exam ?Musculoskeletal:  ?     Arms: ? ? ? ?BP 133/85   Pulse 92   Ht '5\' 4"'$  (1.626 m)   Wt 180 lb (81.6 kg)   BMI 30.90 kg/m?  ?Wt Readings from Last 3 Encounters:  ?07/30/21 180 lb (81.6 kg)  ?04/11/21 186 lb (84.4 kg)  ?04/09/21 186 lb 11.2 oz (84.7 kg)  ? ? ? ?Health Maintenance Due  ?Topic  Date Due  ? COVID-19 Vaccine (1) Never done  ? HIV Screening  Never done  ? Hepatitis C Screening  Never done  ? TETANUS/TDAP  Never done  ? COLONOSCOPY (Pts 45-40yr Insurance coverage will need to be confirmed)  Never done  ? Zoster Vaccines- Shingrix (1 of 2) Never done  ? ? ?There are no preventive care reminders to display for this patient. ? ?Lab Results  ?Component Value Date  ? TSH 0.599 01/11/2014  ? ?Lab Results  ?Component Value Date  ? WBC 5.1 04/09/2021  ? HGB 15.3 04/09/2021  ? HCT 45.8 04/09/2021  ? MCV 88.1 04/09/2021  ? PLT 271 04/09/2021  ? ?Lab Results  ?Component Value Date  ? NA 138 01/28/2020  ? K 3.9 01/28/2020  ? CO2 20 01/28/2020  ? GLUCOSE 124 (H) 01/28/2020  ? BUN 10 01/28/2020  ? CREATININE 1.10 01/28/2020  ? BILITOT 0.4 01/28/2020  ? ALKPHOS 88 01/28/2020  ? AST 19 01/28/2020  ? ALT 31 01/28/2020  ? PROT 7.1 01/28/2020  ? ALBUMIN 4.8 01/28/2020  ? CALCIUM 9.6 01/28/2020  ? ANIONGAP 11 09/07/2016  ? ?No results found for: CHOL ?No results found for: HDL ?No results found for: [place]?No results found for: TRIG ?No results found for: CHOLHDL ?No results found for: HGBA1C ? ?  ?Assessment & Plan:  ? ?Problem List Items Addressed This Visit   ? ?  ? Cardiovascular and Mediastinum  ? Primary hypertension  ?   Patient denies any chest pain or shortness of breath there is no history of palpitation or paroxysmal nocturnal dyspnea ?  patient was advised to follow low-salt low-cholesterol diet ? ?  ideally I want to keep systolic blood pressure below 130 mmHg, patient was asked to check blood pressure one times a week and give me a report on that.  Patient will be follow-up in 3 months  or earlier as needed, patient will call me back for any change in the cardiovascular symptoms ?Patient was advised to buy a book from local bookstore concerning blood pressure and read several chapters  every day.  This will be supplemented by some of the material we will give him from the office.  Patient should  also utilize other resources like YouTube and Internet to learn more about the blood pressure and the diet. ? ?  ?  ?  ? Nervous and Auditory  ? 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  - Patient's back pain is under control with medication.  ?- Encouraged the patient to stretch or do yoga as able to help with back pain ? ?  ?  ?  ?  Other  ? Obesity (BMI 30.0-34.9)  ?  - I encouraged the patient to lose weight.  ?- I educated them on making healthy dietary choices including eating more fruits and vegetables and less fried foods. ?- I encouraged the patient to exercise more, and educated on the benefits of exercise including weight loss, diabetes prevention, and hypertension prevention.  ? Dietary counseling with a registered dietician ? Referral to a weight management support group (e.g. Weight Watchers, Overeaters Anonymous) ?? If your BMI is greater than 29 or you have gained more than 15 pounds you should work on weight loss. ?? Attend a healthy cooking class  ? ?  ?  ?DASH Diet: Care Instructions ?Your Care Instructions ? ?The DASH diet is an eating plan that can help lower your blood pressure. DASH stands for Dietary Approaches to Stop Hypertension. Hypertension is high blood pressure. ?The DASH diet focuses on eating foods that are high in calcium, potassium, and magnesium. These nutrients can lower blood pressure. The foods that are highest in these nutrients are fruits, vegetables, low-fat dairy products, nuts, seeds, and legumes. But taking calcium, potassium, and magnesium supplements instead of eating foods that are high in those nutrients does not have the same effect. The DASH diet also includes whole grains, fish, and poultry. ?The DASH diet is one of several lifestyle changes your doctor may recommend to lower your high blood pressure. Your doctor may also want you to decrease the amount of sodium in your diet. Lowering sodium while following the DASH diet can lower blood pressure even  further than just the DASH diet alone. ?Follow-up care is a key part of your treatment and safety. Be sure to make and go to all appointments, and call your doctor if you are having problems. It's also a goo

## 2021-07-30 NOTE — Assessment & Plan Note (Signed)

## 2021-08-08 ENCOUNTER — Encounter: Payer: Self-pay | Admitting: *Deleted

## 2021-09-09 ENCOUNTER — Emergency Department
Admission: EM | Admit: 2021-09-09 | Discharge: 2021-09-09 | Disposition: A | Discharge status: Home / Self Care | Payer: Commercial Managed Care - PPO | Attending: Student in an Organized Health Care Education/Training Program | Admitting: Student in an Organized Health Care Education/Training Program

## 2021-09-09 ENCOUNTER — Emergency Department: Payer: Commercial Managed Care - PPO

## 2021-09-09 DIAGNOSIS — M25551 Pain in right hip: Secondary | ICD-10-CM | POA: Diagnosis present

## 2021-09-09 DIAGNOSIS — M87051 Idiopathic aseptic necrosis of right femur: Secondary | ICD-10-CM

## 2021-09-09 MED ORDER — OXYCODONE-ACETAMINOPHEN 5-325 MG PO TABS
1.0000 | ORAL_TABLET | Freq: Once | ORAL | Status: AC
  Administered 2021-09-09: 1 via ORAL
  Filled 2021-09-09: qty 1

## 2021-09-09 MED ORDER — OXYCODONE-ACETAMINOPHEN 5-325 MG PO TABS
1.0000 | ORAL_TABLET | ORAL | 0 refills | Status: DC | PRN
Start: 2021-09-09 — End: 2022-02-28

## 2021-10-07 ENCOUNTER — Other Ambulatory Visit: Payer: Self-pay | Admitting: Internal Medicine

## 2021-12-09 ENCOUNTER — Other Ambulatory Visit: Payer: Self-pay | Admitting: Internal Medicine

## 2022-01-01 ENCOUNTER — Ambulatory Visit: Payer: Commercial Managed Care - PPO | Admitting: Internal Medicine

## 2022-01-01 VITALS — BP 121/65 | HR 98

## 2022-01-01 DIAGNOSIS — Z0130 Encounter for examination of blood pressure without abnormal findings: Secondary | ICD-10-CM

## 2022-01-01 NOTE — Progress Notes (Signed)
Patient came for blood pressure check.

## 2022-01-15 ENCOUNTER — Ambulatory Visit (INDEPENDENT_AMBULATORY_CARE_PROVIDER_SITE_OTHER): Payer: Commercial Managed Care - PPO | Admitting: *Deleted

## 2022-01-15 DIAGNOSIS — J020 Streptococcal pharyngitis: Secondary | ICD-10-CM

## 2022-01-15 LAB — POCT RAPID STREP A (OFFICE): Rapid Strep A Screen: POSITIVE — AB

## 2022-01-15 MED ORDER — CLINDAMYCIN HCL 300 MG PO CAPS: 300.0000 mg | ORAL_CAPSULE | Freq: Three times a day (TID) | ORAL | 0 refills | Status: DC

## 2022-02-06 ENCOUNTER — Other Ambulatory Visit: Payer: Self-pay

## 2022-02-08 MED ORDER — PROMETHAZINE HCL 25 MG PO TABS: 25.0000 mg | ORAL_TABLET | Freq: Three times a day (TID) | ORAL | 1 refills | Status: DC

## 2022-02-26 ENCOUNTER — Other Ambulatory Visit: Payer: Self-pay | Admitting: Internal Medicine

## 2022-02-26 ENCOUNTER — Ambulatory Visit: Payer: Commercial Managed Care - PPO | Admitting: Internal Medicine

## 2022-02-28 ENCOUNTER — Ambulatory Visit (INDEPENDENT_AMBULATORY_CARE_PROVIDER_SITE_OTHER): Payer: Commercial Managed Care - PPO | Admitting: Nurse Practitioner

## 2022-02-28 ENCOUNTER — Encounter: Payer: Self-pay | Admitting: Nurse Practitioner

## 2022-02-28 VITALS — BP 132/84 | HR 93 | Ht 64.0 in | Wt 191.9 lb

## 2022-02-28 DIAGNOSIS — Z6832 Body mass index (BMI) 32.0-32.9, adult: Secondary | ICD-10-CM | POA: Diagnosis not present

## 2022-02-28 DIAGNOSIS — E669 Obesity, unspecified: Secondary | ICD-10-CM | POA: Diagnosis not present

## 2022-02-28 DIAGNOSIS — I10 Essential (primary) hypertension: Secondary | ICD-10-CM | POA: Diagnosis not present

## 2022-02-28 DIAGNOSIS — Z131 Encounter for screening for diabetes mellitus: Secondary | ICD-10-CM

## 2022-02-28 NOTE — Progress Notes (Signed)
Established Patient Office Visit  Subjective:  Patient ID: Tony Merkey., male    DOB: [birth year]  Age: 56 y.o. MRN: [id number]  CC:  Chief Complaint  Patient presents with   Weight Gain    Patient states that in past year he has gained 30 pounds, over past 5 years is about 21. He is exercising more now and eating less. Unable to lose weight.      HPI  Tony C Apps Jr. presents for weight issues.  Patient wants to lose weight and has tried lifestyle interventions without significant improvement.  Patient would like to explore options for weight management using medication.  HPI   Past Medical History:  Diagnosis Date   Brain tumor (benign) ([place])    Hypertension     Past Surgical History:  Procedure Laterality Date   VENTRICULOPERITONEAL SHUNT      History reviewed. No pertinent family history.  Social History   Socioeconomic History   Marital status: Married    Spouse name: Not on file   Number of children: Not on file   Years of education: Not on file   Highest education level: Not on file  Occupational History   Not on file  Tobacco Use   Smoking status: Former    Types: Cigarettes   Smokeless tobacco: Never  Substance and Sexual Activity   Alcohol use: Not Currently   Drug use: Never   Sexual activity: Yes  Other Topics Concern   Not on file  Social History Narrative   Not on file   Social Determinants of Health   Financial Resource Strain: Not on file  Food Insecurity: Not on file  Transportation Needs: Not on file  Physical Activity: Not on file  Stress: Not on file  Social Connections: Not on file  Intimate Partner Violence: Not on file     Outpatient Medications Prior to Visit  Medication Sig Dispense Refill   albuterol (VENTOLIN HFA) 108 (90 Base) MCG/ACT inhaler TAKE 2 PUFFS BY MOUTH EVERY 6 HOURS AS NEEDED FOR WHEEZE OR SHORTNESS OF BREATH 8.5 each 1   amLODipine (NORVASC) 10 MG tablet TAKE 1 TABLET BY MOUTH TWICE A DAY 180  tablet 3   lisinopril (ZESTRIL) 10 MG tablet TAKE 1 TABLET BY MOUTH EVERY DAY 90 tablet 3   metoprolol succinate (TOPROL-XL) 25 MG 24 hr tablet TAKE 1 TABLET (25 MG TOTAL) BY MOUTH DAILY. 90 tablet 3   promethazine (PHENERGAN) 25 MG tablet Take 1 tablet (25 mg total) by mouth 3 (three) times daily. 90 tablet 1   azithromycin (ZITHROMAX) 250 MG tablet As directed 6 tablet 0   clindamycin (CLEOCIN) 300 MG capsule Take 1 capsule (300 mg total) by mouth 3 (three) times daily. 21 capsule 0   cyclobenzaprine (FLEXERIL) 10 MG tablet Take 1 tablet (10 mg total) by mouth 3 (three) times daily as needed for muscle spasms. 30 tablet 0   methylPREDNISolone (MEDROL DOSEPAK) 4 MG TBPK tablet As directed 21 each 0   methylPREDNISolone (MEDROL DOSEPAK) 4 MG TBPK tablet Medrol Dosepak as directed 21 tablet 1   methylPREDNISolone (MEDROL DOSEPAK) 4 MG TBPK tablet As directed 21 each 0   methylPREDNISolone (MEDROL DOSEPAK) 4 MG TBPK tablet Medrol Dosepak as directed 21 tablet 0   oxyCODONE-acetaminophen (PERCOCET) 5-325 MG tablet Take 1 tablet by mouth every 4 (four) hours as needed for severe pain. 20 tablet 0   pantoprazole (PROTONIX) 40 MG tablet Take 1 tablet (40 mg total) by  mouth daily. 30 tablet 3   No facility-administered medications prior to visit.    Allergies  Allergen Reactions   Penicillins     ROS Review of Systems  Constitutional: Negative.   HENT: Negative.    Eyes: Negative.   Respiratory:  Negative for chest tightness and shortness of breath.   Cardiovascular:  Negative for chest pain and palpitations.  Gastrointestinal: Negative.   Genitourinary: Negative.   Musculoskeletal:  Positive for arthralgias and joint swelling.  Skin: Negative.   Neurological: Negative.   Psychiatric/Behavioral:  Negative for agitation, behavioral problems and confusion.       Objective:    Physical Exam Constitutional:      Appearance: Normal appearance. He is obese.  HENT:     Head:  Normocephalic and atraumatic.     Right Ear: Tympanic membrane normal.     Left Ear: Tympanic membrane normal.     Nose: Nose normal.     Mouth/Throat:     Mouth: Mucous membranes are moist.     Pharynx: Oropharynx is clear.  Eyes:     Extraocular Movements: Extraocular movements intact.     Conjunctiva/sclera: Conjunctivae normal.     Pupils: Pupils are equal, round, and reactive to light.  Cardiovascular:     Rate and Rhythm: Normal rate and regular rhythm.     Pulses: Normal pulses.  Pulmonary:     Effort: Pulmonary effort is normal.  Abdominal:     General: Bowel sounds are normal. There is no distension.     Palpations: Abdomen is soft.     Hernia: No hernia is present.  Musculoskeletal:        General: No swelling or signs of injury.  Neurological:     General: No focal deficit present.     Mental Status: He is alert and oriented to person, place, and time. Mental status is at baseline.  Psychiatric:        Mood and Affect: Mood normal.        Behavior: Behavior normal.        Thought Content: Thought content normal.        Judgment: Judgment normal.     BP 132/84   Pulse 93   Ht _0  (1.626 m)   Wt 191 lb 14.4 oz (87 kg)   SpO2 98%   BMI 32.94 kg/m  Wt Readings from Last 3 Encounters:  02/28/22 191 lb 14.4 oz (87 kg)  07/30/21 180 lb (81.6 kg)  04/11/21 186 lb (84.4 kg)     Health Maintenance  Topic Date Due   HIV Screening  Never done   Hepatitis C Screening  Never done   DTaP/Tdap/Td (1 - Tdap) Never done   COVID-19 Vaccine (1) 03/16/2022 (Originally 05/06/1966)   Zoster Vaccines- Shingrix (1 of 2) 05/30/2022 (Originally 11/03/2017)   INFLUENZA VACCINE  06/16/2022 (Originally 10/16/2021)   COLONOSCOPY (Pts 45-75yr Insurance coverage will need to be confirmed)  03/01/2023   HPV VACCINES  Aged Out    There are no preventive care reminders to display for this patient.  Lab Results  Component Value Date   TSH 0.89 03/01/2022   Lab Results   Component Value Date   WBC 5.7 03/01/2022   HGB 15.4 03/01/2022   HCT 45.0 03/01/2022   MCV 87.0 03/01/2022   PLT 284 03/01/2022   Lab Results  Component Value Date   NA 138 03/01/2022   K 4.4 03/01/2022   CO2 25 03/01/2022   GLUCOSE  77 03/01/2022   BUN 12 03/01/2022   CREATININE 0.96 03/01/2022   BILITOT 0.5 03/01/2022   ALKPHOS 88 01/28/2020   AST 19 03/01/2022   ALT 28 03/01/2022   PROT 8.0 03/01/2022   ALBUMIN 4.8 01/28/2020   CALCIUM 10.1 03/01/2022   ANIONGAP 11 09/07/2016   EGFR 94 03/01/2022   Lab Results  Component Value Date   CHOL 229 (H) 03/01/2022   Lab Results  Component Value Date   HDL 45 03/01/2022   Lab Results  Component Value Date   LDLCALC 144 (H) 03/01/2022   Lab Results  Component Value Date   TRIG 245 (H) 03/01/2022   Lab Results  Component Value Date   CHOLHDL 5.1 (H) 03/01/2022   Lab Results  Component Value Date   HGBA1C 6.1 (H) 03/01/2022      Assessment & Plan:   Problem List Items Addressed This Visit       Cardiovascular and Mediastinum   Hypertension - Primary    Patient BP  Vitals:   02/28/22 1505  BP: 132/84    in the office day Advised pt to follow a low sodium and heart healthy diet. Continue lisinopril, metoprolol and amlodipine. Courage patient to check blood pressure at home and bring the readings to the next visit. If needed would adjust the medication.      Relevant Orders   CBC with Differential/Platelet (Completed)   COMPLETE METABOLIC PANEL WITH GFR (Completed)   Lipid panel (Completed)   TSH (Completed)     Other   Obesity (BMI 30.0-34.9)    Body mass index is 32.94 kg/m. Advised pt to lose weight. Advised patient to avoid trans fat, fatty and fried food. Follow a regular physical activity schedule. Went over the risk of chronic diseases with increased weight.          Other Visit Diagnoses     Screening for diabetes mellitus (DM)       Relevant Orders   Hemoglobin A1c  (Completed)        No orders of the defined types were placed in this encounter.    Follow-up: No follow-ups on file.    Theresia Lo, NP

## 2022-03-01 ENCOUNTER — Ambulatory Visit: Payer: Commercial Managed Care - PPO

## 2022-03-01 NOTE — Progress Notes (Signed)
Labs drawn according to order

## 2022-03-02 LAB — LIPID PANEL
Cholesterol: 229 mg/dL — ABNORMAL HIGH (ref <200)
HDL: 45 mg/dL (ref >=40)
LDL Cholesterol (Calc): 144 mg/dL (calc) — ABNORMAL HIGH
Non-HDL Cholesterol (Calc): 184 mg/dL (calc) — ABNORMAL HIGH (ref <130)
Total CHOL/HDL Ratio: 5.1 (calc) — ABNORMAL HIGH (ref <5.0)
Triglycerides: 245 mg/dL — ABNORMAL HIGH (ref <150)

## 2022-03-02 LAB — TSH: TSH: 0.89 mIU/L (ref 0.40–4.50)

## 2022-03-02 LAB — COMPLETE METABOLIC PANEL WITH GFR
AG Ratio: 1.6 (calc) (ref 1.0–2.5)
ALT: 28 U/L (ref 9–46)
AST: 19 U/L (ref 10–35)
Albumin: 4.9 g/dL (ref 3.6–5.1)
Alkaline phosphatase (APISO): 98 U/L (ref 35–144)
BUN: 12 mg/dL (ref 7–25)
CO2: 25 mmol/L (ref 20–32)
Calcium: 10.1 mg/dL (ref 8.6–10.3)
Chloride: 101 mmol/L (ref 98–110)
Creat: 0.96 mg/dL (ref 0.70–1.30)
Globulin: 3.1 g/dL (calc) (ref 1.9–3.7)
Glucose, Bld: 77 mg/dL (ref 65–99)
Potassium: 4.4 mmol/L (ref 3.5–5.3)
Sodium: 138 mmol/L (ref 135–146)
Total Bilirubin: 0.5 mg/dL (ref 0.2–1.2)
Total Protein: 8 g/dL (ref 6.1–8.1)
eGFR: 94 mL/min/{1.73_m2} (ref >=60)

## 2022-03-02 LAB — CBC WITH DIFFERENTIAL/PLATELET
Absolute Monocytes: 656 cells/uL (ref 200–950)
Basophils Absolute: 80 cells/uL (ref 0–200)
Basophils Relative: 1.4 %
Eosinophils Absolute: 80 cells/uL (ref 15–500)
Eosinophils Relative: 1.4 %
HCT: 45 % (ref 38.5–50.0)
Hemoglobin: 15.4 g/dL (ref 13.2–17.1)
Lymphs Abs: 1858 cells/uL (ref 850–3900)
MCH: 29.8 pg (ref 27.0–33.0)
MCHC: 34.2 g/dL (ref 32.0–36.0)
MCV: 87 fL (ref 80.0–100.0)
MPV: 9.5 fL (ref 7.5–12.5)
Monocytes Relative: 11.5 %
Neutro Abs: 3027 cells/uL (ref 1500–7800)
Neutrophils Relative %: 53.1 %
Platelets: 284 10*3/uL (ref 140–400)
RBC: 5.17 10*6/uL (ref 4.20–5.80)
RDW: 14 % (ref 11.0–15.0)
Total Lymphocyte: 32.6 %
WBC: 5.7 10*3/uL (ref 3.8–10.8)

## 2022-03-02 LAB — HEMOGLOBIN A1C
Hgb A1c MFr Bld: 6.1 % of total Hgb — ABNORMAL HIGH (ref <5.7)
Mean Plasma Glucose: 128 mg/dL
eAG (mmol/L): 7.1 mmol/L

## 2022-03-05 ENCOUNTER — Encounter: Payer: Self-pay | Admitting: Nurse Practitioner

## 2022-03-06 ENCOUNTER — Encounter: Payer: Self-pay | Admitting: Nurse Practitioner

## 2022-03-06 NOTE — Assessment & Plan Note (Signed)
Body mass index is 32.94 kg/m. Advised pt to lose weight. Advised patient to avoid trans fat, fatty and fried food. Follow a regular physical activity schedule. Went over the risk of chronic diseases with increased weight.

## 2022-03-06 NOTE — Assessment & Plan Note (Addendum)
Patient BP  Vitals:   02/28/22 1505  BP: 132/84    in the office day Advised pt to follow a low sodium and heart healthy diet. Continue lisinopril, metoprolol and amlodipine. Courage patient to check blood pressure at home and bring the readings to the next visit. If needed would adjust the medication.

## 2022-03-25 ENCOUNTER — Encounter: Payer: Self-pay | Admitting: Nurse Practitioner

## 2022-03-25 NOTE — Telephone Encounter (Signed)
Patient came to the office asking for the results of recent blood work. Please advise.

## 2022-03-26 ENCOUNTER — Other Ambulatory Visit: Payer: Self-pay | Admitting: Nurse Practitioner

## 2022-03-26 MED ORDER — OZEMPIC (0.25 OR 0.5 MG/DOSE) 2 MG/3ML ~~LOC~~ SOPN: 0.2500 mg | PEN_INJECTOR | SUBCUTANEOUS | 3 refills | Status: DC

## 2022-09-07 ENCOUNTER — Other Ambulatory Visit: Payer: Self-pay | Admitting: Nurse Practitioner

## 2022-09-10 NOTE — Telephone Encounter (Signed)
Patient need follow up

## 2022-09-18 NOTE — Telephone Encounter (Signed)
Spoke to pt and he needs info to our office sent via my chart and he will call to schedule appt. Does not need the Ozempic right now

## 2022-09-19 NOTE — Telephone Encounter (Signed)
Noted. Thank yoiu 

## 2022-10-17 ENCOUNTER — Encounter: Payer: Self-pay | Admitting: Nurse Practitioner

## 2022-10-17 ENCOUNTER — Ambulatory Visit: Payer: Commercial Managed Care - PPO | Admitting: Nurse Practitioner

## 2022-10-17 VITALS — BP 130/70 | HR 86 | Temp 98.5°F | Ht 63.0 in | Wt 166.8 lb

## 2022-10-17 DIAGNOSIS — E119 Type 2 diabetes mellitus without complications: Secondary | ICD-10-CM | POA: Insufficient documentation

## 2022-10-17 DIAGNOSIS — R7303 Prediabetes: Secondary | ICD-10-CM

## 2022-10-17 DIAGNOSIS — I10 Essential (primary) hypertension: Secondary | ICD-10-CM

## 2022-10-17 DIAGNOSIS — Z1211 Encounter for screening for malignant neoplasm of colon: Secondary | ICD-10-CM | POA: Diagnosis not present

## 2022-10-17 DIAGNOSIS — E785 Hyperlipidemia, unspecified: Secondary | ICD-10-CM | POA: Diagnosis not present

## 2022-10-17 MED ORDER — OZEMPIC (0.25 OR 0.5 MG/DOSE) 2 MG/3ML ~~LOC~~ SOPN: 0.5000 mg | PEN_INJECTOR | SUBCUTANEOUS | 3 refills | Status: DC

## 2022-10-17 MED ORDER — LOSARTAN POTASSIUM-HCTZ 50-12.5 MG PO TABS: 1.0000 | ORAL_TABLET | Freq: Every day | ORAL | 1 refills | Status: DC

## 2022-10-17 NOTE — Patient Instructions (Addendum)
Stop lisinopril and amlodipine. Starred on losartan hydrochlorothiazide. Check your BP daily at same time a send [place] the reading via Mychart in 1 week.

## 2022-10-17 NOTE — Progress Notes (Unsigned)
New Patient Office Visit  Subjective    Patient ID: Tony Paller., male    DOB: [birth year]  Age: 57 y.o. MRN: [id number]  CC:  Chief Complaint  Patient presents with   Establish Care    HPI [name]. presents to establish care.  His previous PCP was Dr. Harl Bowie.  He has history of hypertension and prediabetes.  He is overall doing well and no concerns at present.  Health Maintenance  Topic Date Due   FOOT EXAM  Never done   OPHTHALMOLOGY EXAM  Never done   HIV Screening  Never done   Diabetic kidney evaluation - Urine ACR  Never done   Hepatitis C Screening  Never done   DTaP/Tdap/Td (1 - Tdap) Never done   Zoster Vaccines- Shingrix (1 of 2) Never done   COVID-19 Vaccine (1 - 2023-24 season) Never done   HEMOGLOBIN A1C  08/31/2022   Colonoscopy  03/01/2023   INFLUENZA VACCINE  06/16/2023 (Originally 10/17/2022)   Diabetic kidney evaluation - eGFR measurement  03/02/2023   HPV VACCINES  Aged Out    There are no preventive care reminders to display for this patient.  Outpatient Encounter Medications as of 10/17/2022  Medication Sig   albuterol (VENTOLIN HFA) 108 (90 Base) MCG/ACT inhaler TAKE 2 PUFFS BY MOUTH EVERY 6 HOURS AS NEEDED FOR WHEEZE OR SHORTNESS OF BREATH   losartan-hydrochlorothiazide (HYZAAR) 50-12.5 MG tablet Take 1 tablet by mouth daily.   metoprolol succinate (TOPROL-XL) 25 MG 24 hr tablet TAKE 1 TABLET (25 MG TOTAL) BY MOUTH DAILY.   promethazine (PHENERGAN) 25 MG tablet Take 1 tablet (25 mg total) by mouth 3 (three) times daily.   [DISCONTINUED] amLODipine (NORVASC) 10 MG tablet TAKE 1 TABLET BY MOUTH TWICE A DAY   [DISCONTINUED] lisinopril (ZESTRIL) 10 MG tablet TAKE 1 TABLET BY MOUTH EVERY DAY   [DISCONTINUED] Semaglutide,0.25 or 0.5MG /DOS, (OZEMPIC, 0.25 OR 0.5 MG/DOSE,) 2 MG/3ML SOPN Inject 0.25 mg into the skin once a week.   Semaglutide,0.25 or 0.5MG /DOS, (OZEMPIC, 0.25 OR 0.5 MG/DOSE,) 2 MG/3ML SOPN Inject 0.5 mg into the skin once a  week.   No facility-administered encounter medications on file as of 10/17/2022.    Past Medical History:  Diagnosis Date   Brain tumor (benign) ([facility])    Hypertension     Past Surgical History:  Procedure Laterality Date   VENTRICULOPERITONEAL SHUNT      Family History  Problem Relation Age of Onset   Kidney cancer Father    Hyperlipidemia Paternal Grandmother    Hypertension Paternal Grandmother     Social History   Socioeconomic History   Marital status: Married    Spouse name: Not on file   Number of children: Not on file   Years of education: Not on file   Highest education level: Not on file  Occupational History   Not on file  Tobacco Use   Smoking status: Former    Current packs/day: 0.00    Types: Cigarettes    Quit date: 2014    Years since quitting: 10.6   Smokeless tobacco: Never  Substance and Sexual Activity   Alcohol use: Not Currently   Drug use: Never   Sexual activity: Yes  Other Topics Concern   Not on file  Social History Narrative   Not on file   Social Determinants of Health   Financial Resource Strain: Not on file  Food Insecurity: Not on file  Transportation Needs: Not on file  Physical Activity: Not on file  Stress: Not on file  Social Connections: Not on file  Intimate Partner Violence: Not on file    ROS Negative unless indicated in HPI.      Objective    BP 130/70   Pulse 86   Temp 98.5 F (36.9 C) (Oral)   Ht 5\' 3"  (1.6 m)   Wt 166 lb 12.8 oz (75.7 kg)   SpO2 96%   BMI 29.55 kg/m   Physical Exam Constitutional:      Appearance: Normal appearance. He is normal weight.  HENT:     Head: Normocephalic.     Right Ear: Tympanic membrane normal.     Left Ear: Tympanic membrane normal.     Mouth/Throat:     Mouth: Mucous membranes are moist.  Eyes:     Extraocular Movements: Extraocular movements intact.     Conjunctiva/sclera: Conjunctivae normal.     Pupils: Pupils are equal, round, and reactive to light.   Neck:     Thyroid: No thyroid mass or thyroid tenderness.  Cardiovascular:     Rate and Rhythm: Normal rate and regular rhythm.     Pulses: Normal pulses.     Heart sounds: Normal heart sounds. No murmur heard. Pulmonary:     Effort: Pulmonary effort is normal.     Breath sounds: Normal breath sounds.  Abdominal:     General: Bowel sounds are normal.     Palpations: Abdomen is soft. There is no mass.     Tenderness: There is no abdominal tenderness. There is no rebound.  Musculoskeletal:        General: No swelling.     Cervical back: Neck supple. No tenderness.     Right lower leg: No edema.     Left lower leg: No edema.  Skin:    Findings: No bruising, erythema or rash.  Neurological:     General: No focal deficit present.     Mental Status: He is alert and oriented to person, place, and time. Mental status is at baseline.  Psychiatric:        Mood and Affect: Mood normal.        Behavior: Behavior normal.        Thought Content: Thought content normal.        Judgment: Judgment normal.         Assessment & Plan:  Encounter for screening colonoscopy -     Ambulatory referral to Gastroenterology  Hypertension, unspecified type Assessment & Plan: Will treat with losartan/HCTZ. Discontinue lisinopril. Advised patient to check blood pressure at home and send the numbers via MyChart. Will continue to monitor.   Prediabetes Assessment & Plan: Previous hemoglobin A1c 6.1 on 03/01/2022. Will increase to Ozempic 0.5 once weekly. Nutrition counseling provided.   Hyperlipidemia, unspecified hyperlipidemia type Assessment & Plan: Lab Results  Component Value Date   CHOL 229 (H) 03/01/2022   HDL 45 03/01/2022   LDLCALC 144 (H) 03/01/2022   TRIG 245 (H) 03/01/2022   CHOLHDL 5.1 (H) 03/01/2022  Statin therapy discussed with patient. Patient would like to do lifestyle intervention before starting statin therapy. Fasting labs ordered.    Other orders -      Losartan Potassium-HCTZ; Take 1 tablet by mouth daily.  Dispense: 90 tablet; Refill: 1 -     Ozempic (0.25 or 0.5 MG/DOSE); Inject 0.5 mg into the skin once a week.  Dispense: 3 mL; Refill: 3    Return in about 4 months (  around 02/16/2023).   Kara Dies, NP

## 2022-10-18 ENCOUNTER — Telehealth: Payer: Self-pay | Admitting: Nurse Practitioner

## 2022-10-18 NOTE — Telephone Encounter (Signed)
Patient need lab orders.

## 2022-10-24 ENCOUNTER — Other Ambulatory Visit: Payer: Self-pay

## 2022-10-24 ENCOUNTER — Other Ambulatory Visit: Payer: Commercial Managed Care - PPO

## 2022-10-24 DIAGNOSIS — Z125 Encounter for screening for malignant neoplasm of prostate: Secondary | ICD-10-CM

## 2022-10-24 DIAGNOSIS — I10 Essential (primary) hypertension: Secondary | ICD-10-CM

## 2022-10-24 DIAGNOSIS — R7303 Prediabetes: Secondary | ICD-10-CM

## 2022-10-24 DIAGNOSIS — E785 Hyperlipidemia, unspecified: Secondary | ICD-10-CM

## 2022-10-30 DIAGNOSIS — E785 Hyperlipidemia, unspecified: Secondary | ICD-10-CM | POA: Insufficient documentation

## 2022-10-30 DIAGNOSIS — R7303 Prediabetes: Secondary | ICD-10-CM | POA: Insufficient documentation

## 2022-10-30 NOTE — Assessment & Plan Note (Signed)
Previous hemoglobin A1c 6.1 on 03/01/2022. Will increase to Ozempic 0.5 once weekly. Nutrition counseling provided.

## 2022-10-30 NOTE — Assessment & Plan Note (Signed)
Lab Results  Component Value Date   CHOL 229 (H) 03/01/2022   HDL 45 03/01/2022   LDLCALC 144 (H) 03/01/2022   TRIG 245 (H) 03/01/2022   CHOLHDL 5.1 (H) 03/01/2022  Statin therapy discussed with patient. Patient would like to do lifestyle intervention before starting statin therapy. Fasting labs ordered.

## 2022-10-30 NOTE — Assessment & Plan Note (Signed)
Will treat with losartan/HCTZ. Discontinue lisinopril. Advised patient to check blood pressure at home and send the numbers via MyChart. Will continue to monitor.

## 2023-02-18 ENCOUNTER — Ambulatory Visit: Payer: Commercial Managed Care - PPO | Admitting: Nurse Practitioner

## 2023-03-19 NOTE — Progress Notes (Deleted)
 [name]

## 2023-03-20 ENCOUNTER — Ambulatory Visit: Payer: Commercial Managed Care - PPO | Admitting: Nurse Practitioner

## 2023-04-15 ENCOUNTER — Other Ambulatory Visit: Payer: Self-pay | Admitting: Nurse Practitioner

## 2023-04-15 ENCOUNTER — Telehealth: Payer: Self-pay

## 2023-04-15 NOTE — Telephone Encounter (Signed)
Patient was called and scheduled for 04/22/2023 @ 3:40 with Evelene Croon for follow up.

## 2023-04-15 NOTE — Telephone Encounter (Signed)
Please reschedule pt 4 month f/u.  He was a no-show earlier in the month.  Pt has several health maintenance care gaps that need addressed.

## 2023-04-22 ENCOUNTER — Ambulatory Visit (INDEPENDENT_AMBULATORY_CARE_PROVIDER_SITE_OTHER): Payer: Commercial Managed Care - PPO | Admitting: Nurse Practitioner

## 2023-04-22 ENCOUNTER — Encounter: Payer: Self-pay | Admitting: Nurse Practitioner

## 2023-04-22 VITALS — BP 120/76 | HR 96 | Temp 97.8°F | Ht 63.0 in | Wt 186.8 lb

## 2023-04-22 DIAGNOSIS — R7303 Prediabetes: Secondary | ICD-10-CM | POA: Diagnosis not present

## 2023-04-22 DIAGNOSIS — E119 Type 2 diabetes mellitus without complications: Secondary | ICD-10-CM | POA: Diagnosis not present

## 2023-04-22 DIAGNOSIS — E785 Hyperlipidemia, unspecified: Secondary | ICD-10-CM | POA: Diagnosis not present

## 2023-04-22 DIAGNOSIS — Z125 Encounter for screening for malignant neoplasm of prostate: Secondary | ICD-10-CM | POA: Diagnosis not present

## 2023-04-22 DIAGNOSIS — Z1211 Encounter for screening for malignant neoplasm of colon: Secondary | ICD-10-CM

## 2023-04-22 DIAGNOSIS — G43809 Other migraine, not intractable, without status migrainosus: Secondary | ICD-10-CM

## 2023-04-22 DIAGNOSIS — Z8249 Family history of ischemic heart disease and other diseases of the circulatory system: Secondary | ICD-10-CM

## 2023-04-22 DIAGNOSIS — I10 Essential (primary) hypertension: Secondary | ICD-10-CM | POA: Diagnosis not present

## 2023-04-22 NOTE — Progress Notes (Signed)
 Established Patient Office Visit  Subjective:  Patient ID: Tony Zagami., male    DOB: Aug 10, 1965  Age: 58 y.o. MRN: [id number]  CC:  Chief Complaint  Patient presents with   Medical Management of Chronic Issues   Discussed the use of a AI scribe software for clinical note transcription with the patient, who gave verbal consent to proceed.  HPI  [name]. presents for chronic disease follow-up.  He has history of hypertension, hyperlipidemia, diabetes and headache.  He  has experienced a weight increase from 166 pounds to 186 pounds. His last recorded A1c was 6.1. He restarted Ozempic  at a dose of 1.5 mg last month after a two-month hiatus due to cost issues.   He is currently taking losartan  for blood pressure management, which is well-controlled at 120/76 mmHg. He is no longer taking metoprolol  and uses an albuterol  inhaler very rarely.  He has a history of migraines, which he attributes to past brain surgeries, a fractured skull, and a broken neck. His migraines are often triggered by pressure changes. Over the years, he has tried various treatments, including hydrocodone and promethazine , which he finds helpful for nausea associated with migraines. He also uses CBD gummies for relief. He has not consulted a neurologist since 2016.  He has a significant family history of cardiovascular issues, with multiple paternal uncles and his grandfather having died from heart attacks or strokes between the ages of 110 and 79.  HPI   Past Medical History:  Diagnosis Date   Brain tumor (benign) (HCC)    Hypertension     Past Surgical History:  Procedure Laterality Date   VENTRICULOPERITONEAL SHUNT      Family History  Problem Relation Age of Onset   Kidney cancer Father    Hyperlipidemia Paternal Grandmother    Hypertension Paternal Grandmother    Stroke Paternal Grandfather    Heart failure Paternal Grandfather    Stroke Paternal Uncle    Heart failure Paternal  Uncle    Early death Paternal Uncle     Social History   Socioeconomic History   Marital status: Married    Spouse name: Not on file   Number of children: Not on file   Years of education: Not on file   Highest education level: Not on file  Occupational History   Not on file  Tobacco Use   Smoking status: Former    Current packs/day: 0.00    Types: Cigarettes    Quit date: 2014    Years since quitting: 11.1   Smokeless tobacco: Never  Substance and Sexual Activity   Alcohol  use: Not Currently   Drug use: Never   Sexual activity: Yes  Other Topics Concern   Not on file  Social History Narrative   Not on file   Social Drivers of Health   Financial Resource Strain: Not on file  Food Insecurity: Not on file  Transportation Needs: Not on file  Physical Activity: Not on file  Stress: Not on file  Social Connections: Not on file  Intimate Partner Violence: Not on file     Outpatient Medications Prior to Visit  Medication Sig Dispense Refill   albuterol  (VENTOLIN  HFA) 108 (90 Base) MCG/ACT inhaler TAKE 2 PUFFS BY MOUTH EVERY 6 HOURS AS NEEDED FOR WHEEZE OR SHORTNESS OF BREATH 8.5 each 1   losartan -hydrochlorothiazide (HYZAAR) 50-12.5 MG tablet TAKE 1 TABLET BY MOUTH EVERY DAY 90 tablet 3   promethazine  (PHENERGAN ) 25 MG tablet Take  1 tablet (25 mg total) by mouth 3 (three) times daily. 90 tablet 1   Semaglutide ,0.25 or 0.5MG /DOS, (OZEMPIC , 0.25 OR 0.5 MG/DOSE,) 2 MG/3ML SOPN Inject 0.5 mg into the skin once a week. 3 mL 3   metoprolol  succinate (TOPROL -XL) 25 MG 24 hr tablet TAKE 1 TABLET (25 MG TOTAL) BY MOUTH DAILY. 90 tablet 3   No facility-administered medications prior to visit.    Allergies  Allergen Reactions   Ivp Dye [Iodinated Contrast Media]    Penicillins     ROS Review of Systems Negative unless indicated in HPI.    Objective:    Physical Exam Constitutional:      Appearance: Normal appearance. He is obese.  HENT:     Mouth/Throat:      Mouth: Mucous membranes are moist.  Eyes:     Conjunctiva/sclera: Conjunctivae normal.     Pupils: Pupils are equal, round, and reactive to light.  Cardiovascular:     Rate and Rhythm: Normal rate and regular rhythm.     Pulses: Normal pulses.     Heart sounds: Normal heart sounds.  Pulmonary:     Effort: Pulmonary effort is normal.     Breath sounds: Normal breath sounds.  Abdominal:     General: Bowel sounds are normal.     Palpations: Abdomen is soft.  Musculoskeletal:     Cervical back: Normal range of motion. No tenderness.  Skin:    General: Skin is warm.     Findings: No bruising.  Neurological:     General: No focal deficit present.     Mental Status: He is alert and oriented to person, place, and time. Mental status is at baseline.  Psychiatric:        Mood and Affect: Mood normal.        Behavior: Behavior normal.        Thought Content: Thought content normal.        Judgment: Judgment normal.     BP 120/76   Pulse 96   Temp 97.8 F (36.6 C)   Ht 5' 3 (1.6 m)   Wt 186 lb 12.8 oz (84.7 kg)   SpO2 96%   BMI 33.09 kg/m  Wt Readings from Last 3 Encounters:  04/22/23 186 lb 12.8 oz (84.7 kg)  10/17/22 166 lb 12.8 oz (75.7 kg)  02/28/22 191 lb 14.4 oz (87 kg)     Health Maintenance  Topic Date Due   Pneumococcal Vaccine 2-24 Years old (1 of 2 - PCV) Never done   OPHTHALMOLOGY EXAM  Never done   HIV Screening  Never done   Hepatitis C Screening  Never done   DTaP/Tdap/Td (1 - Tdap) Never done   Zoster Vaccines- Shingrix (1 of 2) Never done   Colonoscopy  03/01/2023   COVID-19 Vaccine (1 - 2024-25 season) 05/07/2023 (Originally 11/17/2022)   INFLUENZA VACCINE  06/16/2023 (Originally 10/17/2022)   HEMOGLOBIN A1C  10/20/2023   Diabetic kidney evaluation - eGFR measurement  04/21/2024   Diabetic kidney evaluation - Urine ACR  04/21/2024   FOOT EXAM  04/21/2024   HPV VACCINES  Aged Out    There are no preventive care reminders to display for this  patient.  Lab Results  Component Value Date   TSH 1.12 04/22/2023   Lab Results  Component Value Date   WBC 6.5 04/22/2023   HGB 15.0 04/22/2023   HCT 43.9 04/22/2023   MCV 90.1 04/22/2023   PLT 281.0 04/22/2023   Lab  Results  Component Value Date   NA 133 (L) 04/22/2023   K 4.0 04/22/2023   CO2 27 04/22/2023   GLUCOSE 76 04/22/2023   BUN 16 04/22/2023   CREATININE 1.22 04/22/2023   BILITOT 0.5 04/22/2023   ALKPHOS 72 04/22/2023   AST 18 04/22/2023   ALT 28 04/22/2023   PROT 7.2 04/22/2023   ALBUMIN 4.7 04/22/2023   CALCIUM  9.7 04/22/2023   ANIONGAP 11 09/07/2016   EGFR 94 03/01/2022   GFR 66.76 04/22/2023   Lab Results  Component Value Date   CHOL 226 (H) 04/22/2023   Lab Results  Component Value Date   HDL 37.00 (L) 04/22/2023   Lab Results  Component Value Date   LDLCALC 144 (H) 03/01/2022   Lab Results  Component Value Date   TRIG (H) 04/22/2023    568.0 Triglyceride is over 400; calculations on Lipids are invalid.   Lab Results  Component Value Date   CHOLHDL 6 04/22/2023   Lab Results  Component Value Date   HGBA1C 6.1 04/22/2023      Assessment & Plan:  Hypertension, unspecified type Assessment & Plan: Patient BP  Vitals:   04/22/23 1538  BP: 120/76    in the office  -Advised pt to follow a low sodium and heart healthy diet.    Orders: -     TSH -     Comprehensive metabolic panel -     Ambulatory referral to Cardiology  Encounter for screening colonoscopy -     Ambulatory referral to Gastroenterology  Diabetes mellitus without complication [facility]) Assessment & Plan: Patient has gained 20 pounds since last visit. -Discuss lifestyle modifications including diet and exercise. -Will check hgA1c   Orders: -     Microalbumin / creatinine urine ratio  Family history of heart disease Assessment & Plan: Family history of early cardiac events. -Send referral to cardiology for further evaluation.   Orders: -     Ambulatory  referral to Cardiology  Prostate cancer screening -     PSA  Hyperlipidemia, unspecified hyperlipidemia type Assessment & Plan: Last lipid profile showed elevated cholesterol. Family history of early cardiac events. -Order lipid profile today. -Consider starting statin therapy depending on results.   Orders: -     Lipid panel -     Ambulatory referral to Cardiology  Prediabetes -     CBC with Differential/Platelet -     Hemoglobin A1c  Other migraine without status migrainosus, not intractable Assessment & Plan: Patient reports history of migraines, currently managed with Tylenol  and CBD gummies. -Advised to take magnesium supplement daily. -Consider referral to neurology for further management.    Other orders -     LDL cholesterol, direct    Follow-up: Return in about 6 months (around 10/20/2023).   Linnell Swords, NP

## 2023-04-23 DIAGNOSIS — G43909 Migraine, unspecified, not intractable, without status migrainosus: Secondary | ICD-10-CM | POA: Insufficient documentation

## 2023-04-23 DIAGNOSIS — Z8249 Family history of ischemic heart disease and other diseases of the circulatory system: Secondary | ICD-10-CM | POA: Insufficient documentation

## 2023-04-23 LAB — CBC WITH DIFFERENTIAL/PLATELET
Basophils Absolute: 0.1 10*3/uL (ref 0.0–0.1)
Basophils Relative: 1.7 % (ref 0.0–3.0)
Eosinophils Absolute: 0.1 10*3/uL (ref 0.0–0.7)
Eosinophils Relative: 1.5 % (ref 0.0–5.0)
HCT: 43.9 % (ref 39.0–52.0)
Hemoglobin: 15 g/dL (ref 13.0–17.0)
Lymphocytes Relative: 35.6 % (ref 12.0–46.0)
Lymphs Abs: 2.3 10*3/uL (ref 0.7–4.0)
MCHC: 34.2 g/dL (ref 30.0–36.0)
MCV: 90.1 fL (ref 78.0–100.0)
Monocytes Absolute: 0.8 10*3/uL (ref 0.1–1.0)
Monocytes Relative: 12.7 % — ABNORMAL HIGH (ref 3.0–12.0)
Neutro Abs: 3.2 10*3/uL (ref 1.4–7.7)
Neutrophils Relative %: 48.5 % (ref 43.0–77.0)
Platelets: 281 10*3/uL (ref 150.0–400.0)
RBC: 4.87 Mil/uL (ref 4.22–5.81)
RDW: 13.8 % (ref 11.5–15.5)
WBC: 6.5 10*3/uL (ref 4.0–10.5)

## 2023-04-23 LAB — LIPID PANEL
Cholesterol: 226 mg/dL — ABNORMAL HIGH (ref 0–200)
HDL: 37 mg/dL — ABNORMAL LOW (ref >39.00)
NonHDL: 189.38
Total CHOL/HDL Ratio: 6
Triglycerides: 568 mg/dL — ABNORMAL HIGH (ref 0.0–149.0)
VLDL: 113.6 mg/dL — ABNORMAL HIGH (ref 0.0–40.0)

## 2023-04-23 LAB — COMPREHENSIVE METABOLIC PANEL
ALT: 28 U/L (ref 0–53)
AST: 18 U/L (ref 0–37)
Albumin: 4.7 g/dL (ref 3.5–5.2)
Alkaline Phosphatase: 72 U/L (ref 39–117)
BUN: 16 mg/dL (ref 6–23)
CO2: 27 meq/L (ref 19–32)
Calcium: 9.7 mg/dL (ref 8.4–10.5)
Chloride: 96 meq/L (ref 96–112)
Creatinine, Ser: 1.22 mg/dL (ref 0.40–1.50)
GFR: 66.76 mL/min (ref >60.00)
Glucose, Bld: 76 mg/dL (ref 70–99)
Potassium: 4 meq/L (ref 3.5–5.1)
Sodium: 133 meq/L — ABNORMAL LOW (ref 135–145)
Total Bilirubin: 0.5 mg/dL (ref 0.2–1.2)
Total Protein: 7.2 g/dL (ref 6.0–8.3)

## 2023-04-23 LAB — LDL CHOLESTEROL, DIRECT: Direct LDL: 123 mg/dL

## 2023-04-23 LAB — MICROALBUMIN / CREATININE URINE RATIO
Creatinine,U: 75.3 mg/dL
Microalb Creat Ratio: 1.3 mg/g (ref 0.0–30.0)
Microalb, Ur: 1 mg/dL (ref 0.0–1.9)

## 2023-04-23 LAB — TSH: TSH: 1.12 u[IU]/mL (ref 0.35–5.50)

## 2023-04-23 LAB — PSA: PSA: 1.1 ng/mL (ref 0.10–4.00)

## 2023-04-23 LAB — HEMOGLOBIN A1C: Hgb A1c MFr Bld: 6.1 % (ref 4.6–6.5)

## 2023-04-23 NOTE — Assessment & Plan Note (Signed)
 Patient reports history of migraines, currently managed with Tylenol  and CBD gummies. -Advised to take magnesium supplement daily. -Consider referral to neurology for further management.

## 2023-04-23 NOTE — Assessment & Plan Note (Signed)
Family history of early cardiac events. -Send referral to cardiology for further evaluation.

## 2023-04-23 NOTE — Assessment & Plan Note (Signed)
Last lipid profile showed elevated cholesterol. Family history of early cardiac events. -Order lipid profile today. -Consider starting statin therapy depending on results.

## 2023-04-23 NOTE — Assessment & Plan Note (Signed)
 Patient has gained 20 pounds since last visit. -Discuss lifestyle modifications including diet and exercise. -Will check hgA1c

## 2023-04-23 NOTE — Assessment & Plan Note (Signed)
 Patient BP  Vitals:   04/22/23 1538  BP: 120/76    in the office  -Advised pt to follow a low sodium and heart healthy diet.

## 2023-04-24 ENCOUNTER — Encounter: Payer: Self-pay | Admitting: Nurse Practitioner

## 2023-04-24 ENCOUNTER — Other Ambulatory Visit: Payer: Self-pay | Admitting: Nurse Practitioner

## 2023-04-24 DIAGNOSIS — E781 Pure hyperglyceridemia: Secondary | ICD-10-CM

## 2023-04-24 DIAGNOSIS — E871 Hypo-osmolality and hyponatremia: Secondary | ICD-10-CM

## 2023-04-24 MED ORDER — FENOFIBRATE 48 MG PO TABS: 48.0000 mg | ORAL_TABLET | Freq: Every day | ORAL | 1 refills | Status: DC

## 2023-04-24 NOTE — Progress Notes (Signed)
 Please call pt to schedule fasting lab in 4 weeks.

## 2023-05-01 ENCOUNTER — Encounter: Payer: Self-pay | Admitting: *Deleted

## 2023-05-07 ENCOUNTER — Other Ambulatory Visit: Payer: Self-pay | Admitting: Nurse Practitioner

## 2023-05-07 DIAGNOSIS — E781 Pure hyperglyceridemia: Secondary | ICD-10-CM

## 2023-05-23 ENCOUNTER — Other Ambulatory Visit: Payer: Commercial Managed Care - PPO

## 2023-07-04 ENCOUNTER — Other Ambulatory Visit: Payer: Self-pay | Admitting: Nurse Practitioner

## 2023-07-21 ENCOUNTER — Other Ambulatory Visit: Payer: Self-pay | Admitting: Nurse Practitioner

## 2023-10-02 ENCOUNTER — Emergency Department
Admission: EM | Admit: 2023-10-02 | Discharge: 2023-10-02 | Disposition: A | Discharge status: Home / Self Care | Attending: Emergency Medicine | Admitting: Emergency Medicine

## 2023-10-02 ENCOUNTER — Other Ambulatory Visit: Payer: Self-pay

## 2023-10-02 ENCOUNTER — Encounter: Payer: Self-pay | Admitting: Emergency Medicine

## 2023-10-02 ENCOUNTER — Emergency Department

## 2023-10-02 DIAGNOSIS — R55 Syncope and collapse: Secondary | ICD-10-CM | POA: Diagnosis present

## 2023-10-02 DIAGNOSIS — X30XXXA Exposure to excessive natural heat, initial encounter: Secondary | ICD-10-CM | POA: Insufficient documentation

## 2023-10-02 DIAGNOSIS — F419 Anxiety disorder, unspecified: Secondary | ICD-10-CM | POA: Insufficient documentation

## 2023-10-02 DIAGNOSIS — I10 Essential (primary) hypertension: Secondary | ICD-10-CM | POA: Insufficient documentation

## 2023-10-02 DIAGNOSIS — E860 Dehydration: Secondary | ICD-10-CM | POA: Insufficient documentation

## 2023-10-02 DIAGNOSIS — T671XXA Heat syncope, initial encounter: Secondary | ICD-10-CM | POA: Insufficient documentation

## 2023-10-02 LAB — CBC
HCT: 47.3 % (ref 39.0–52.0)
Hemoglobin: 16.7 g/dL (ref 13.0–17.0)
MCH: 29.9 pg (ref 26.0–34.0)
MCHC: 35.3 g/dL (ref 30.0–36.0)
MCV: 84.6 fL (ref 80.0–100.0)
Platelets: 279 K/uL (ref 150–400)
RBC: 5.59 MIL/uL (ref 4.22–5.81)
RDW: 13.1 % (ref 11.5–15.5)
WBC: 12.1 K/uL — ABNORMAL HIGH (ref 4.0–10.5)
nRBC: 0 % (ref 0.0–0.2)

## 2023-10-02 LAB — TROPONIN I (HIGH SENSITIVITY)
Troponin I (High Sensitivity): 24 ng/L — ABNORMAL HIGH (ref <18)
Troponin I (High Sensitivity): 28 ng/L — ABNORMAL HIGH (ref <18)

## 2023-10-02 LAB — BASIC METABOLIC PANEL WITH GFR
Anion gap: 16 — ABNORMAL HIGH (ref 5–15)
BUN: 18 mg/dL (ref 6–20)
CO2: 24 mmol/L (ref 22–32)
Calcium: 10.5 mg/dL — ABNORMAL HIGH (ref 8.9–10.3)
Chloride: 93 mmol/L — ABNORMAL LOW (ref 98–111)
Creatinine, Ser: 1.75 mg/dL — ABNORMAL HIGH (ref 0.61–1.24)
GFR, Estimated: 45 mL/min — ABNORMAL LOW (ref >60)
Glucose, Bld: 84 mg/dL (ref 70–99)
Potassium: 4.5 mmol/L (ref 3.5–5.1)
Sodium: 133 mmol/L — ABNORMAL LOW (ref 135–145)

## 2023-10-02 MED ORDER — DIAZEPAM 5 MG PO TABS: 5.0000 mg | ORAL_TABLET | Freq: Three times a day (TID) | ORAL | 0 refills | Status: DC | PRN

## 2023-10-02 NOTE — ED Provider Notes (Signed)
 [facility] Provider Note    Event Date/Time   First MD Initiated Contact with Patient 10/02/23 1712     (approximate)   History   Chief Complaint: Chest Pain   HPI  Tony Decandia. is a 58 y.o. male with a history of hypertension who comes ED complaining of syncope.  Patient reports being in his usual state of health, completed multiple hours on a job site and 90 degree humidity heat, had not had anything to eat or drink since last night, and while driving home started to get lightheaded.  Pulled over to the side of the road, had an episode of vomiting, knelt down and then passed out.  Denies any pain or significant injury.   Symptoms are now resolved.  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  This episode happened after receiving a phone call that there was a problem with the work he had just completed and that he would need to redo it.  This caused him to have immediate chest pressure and anxiousness.  He does report frequent episodes anxiety and this feels similar.  Denies any recent exertional symptoms.  No shortness of breath or diaphoresis or radiation.  Has an appointment with his PCP in 2 weeks.  Saw cardiology a year ago, had EKG and stress test which were reassuring.       Past Medical History:  Diagnosis Date   Brain tumor (benign) [facility])    Hypertension     Current Outpatient Rx   Order #: [id number] Class: Normal   Order #: [id number] Class: Normal   Order #: [id number] Class: Normal   Order #: [id number] Class: Normal   Order #: [id number] Class: Normal   Order #: [id number] Class: Normal   Order #: [id number] Class: Normal    Past Surgical History:  Procedure Laterality Date   VENTRICULOPERITONEAL SHUNT      Physical Exam   Triage Vital Signs: ED Triage Vitals [10/02/23 1329]  Encounter Vitals Group     BP (!) 160/108     Girls Systolic BP Percentile      Girls  Diastolic BP Percentile      Boys Systolic BP Percentile      Boys Diastolic BP Percentile      Pulse Rate (!) 115     Resp 17     Temp 98.4 F (36.9 C)     Temp Source Oral     SpO2 97 %     Weight 167 lb (75.8 kg)     Height 5' 3 (1.6 m)     Head Circumference      Peak Flow      Pain Score 4     Pain Loc      Pain Education      Exclude from Growth Chart     Most recent vital signs: Vitals:   10/02/23 1329 10/02/23 1707  BP: (!) 160/108 (!) 171/95  Pulse: (!) 115 97  Resp: 17 18  Temp: 98.4 F (36.9 C) 98.9 F (37.2 C)  SpO2: 97% 98%    General: Awake, no distress.  CV:  Good peripheral perfusion.  Tachycardia heart rate 105 Resp:  Normal effort.  Clear to auscultation bilaterally Abd:  No distention.  Soft nontender Other:  Dry oral mucosa   ED Results / Procedures / Treatments   Labs (all labs ordered are listed, but only abnormal results are displayed) Labs Reviewed  BASIC METABOLIC PANEL WITH GFR - Abnormal; Notable for the following components:      Result Value   Sodium 133 (*)    Chloride 93 (*)    Creatinine, Ser 1.75 (*)    Calcium 10.5 (*)    GFR, Estimated 45 (*)    Anion gap 16 (*)    All other components within normal limits  CBC - Abnormal; Notable for the following components:   WBC 12.1 (*)    All other components within normal limits  TROPONIN I (HIGH SENSITIVITY) - Abnormal; Notable for the following components:   Troponin I (High Sensitivity) 24 (*)    All other components within normal limits  TROPONIN I (HIGH SENSITIVITY) - Abnormal; Notable for the following components:   Troponin I (High Sensitivity) 28 (*)    All other components within normal limits     EKG Interpreted by me Sinus tachycardia rate 116.  Normal axis, normal intervals.  Normal QRS ST segments and T waves.  No ischemic changes.   RADIOLOGY Chest x-ray interpreted by me, unremarkable.  Radiology report  reviewed   PROCEDURES:  Procedures   MEDICATIONS ORDERED IN ED: Medications - No data to display   IMPRESSION / MDM / ASSESSMENT AND PLAN / ED COURSE  I reviewed the triage vital signs and the nursing notes.  DDx: Heat syncope, dehydration, AKI, electrolyte derangement, non-STEMI.  Doubt PE dissection pericardial effusion Boerhaave syndrome hyperthyroidism  Patient's presentation is most consistent with acute presentation with potential threat to life or bodily function.  Patient presents with episode of syncope with history highly suspicious for dehydration and heat exhaustion.  Serial troponins unremarkable, EKG nonischemic.  Labs show a mild AKI.  Offered IV fluid hydration in the ED, patient declines and states that he will drink plenty of fluids.  Encouraged him to focus on salty snacks along with water intake while exposed to prolonged hot environment.  Will prescribe oral Valium  for anxiety symptoms until he can follow-up with his PCP in 2 weeks.       FINAL CLINICAL IMPRESSION(S) / ED DIAGNOSES   Final diagnoses:  Dehydration  Anxiety  Heat syncope, initial encounter     Rx / DC Orders   ED Discharge Orders          Ordered    diazepam  (VALIUM ) 5 MG tablet  Every 8 hours PRN        10/02/23 1736             Note:  This document was prepared using Dragon voice recognition software and may include unintentional dictation errors.   Viviann Pastor, MD 10/02/23 [phone]

## 2023-10-02 NOTE — ED Triage Notes (Signed)
 Patient to ED via POV from [facility] for Chest pain. PT reports working in the heat today then had a phone call with bad news. Directly afterwards chest pressure started in central chest with N/V. PT reports having a syncopal episode as well. Denies cardiac hx.

## 2023-10-05 ENCOUNTER — Encounter: Payer: Self-pay | Admitting: Nurse Practitioner

## 2023-10-05 ENCOUNTER — Telehealth: Admitting: Family

## 2023-10-05 DIAGNOSIS — F411 Generalized anxiety disorder: Secondary | ICD-10-CM | POA: Diagnosis not present

## 2023-10-05 DIAGNOSIS — Z09 Encounter for follow-up examination after completed treatment for conditions other than malignant neoplasm: Secondary | ICD-10-CM

## 2023-10-05 DIAGNOSIS — F410 Panic disorder [episodic paroxysmal anxiety] without agoraphobia: Secondary | ICD-10-CM | POA: Diagnosis not present

## 2023-10-05 MED ORDER — ESCITALOPRAM OXALATE 10 MG PO TABS: 10.0000 mg | ORAL_TABLET | Freq: Every day | ORAL | 1 refills | Status: DC

## 2023-10-05 MED ORDER — HYDROXYZINE PAMOATE 25 MG PO CAPS: 25.0000 mg | ORAL_CAPSULE | Freq: Three times a day (TID) | ORAL | 0 refills | Status: DC | PRN

## 2023-10-05 NOTE — Patient Instructions (Signed)

## 2023-10-05 NOTE — Progress Notes (Signed)
 Virtual Visit Consent   Tony C Bradstreet Jr., you are scheduled for a virtual visit with a [facility] provider today. Just as with appointments in the office, your consent must be obtained to participate. Your consent will be active for this visit and any virtual visit you may have with one of our providers in the next 365 days. If you have a MyChart account, a copy of this consent can be sent to you electronically.  As this is a virtual visit, video technology does not allow for your provider to perform a traditional examination. This may limit your provider's ability to fully assess your condition. If your provider identifies any concerns that need to be evaluated in person or the need to arrange testing (such as labs, EKG, etc.), we will make arrangements to do so. Although advances in technology are sophisticated, we cannot ensure that it will always work on either your end or our end. If the connection with a video visit is poor, the visit may have to be switched to a telephone visit. With either a video or telephone visit, we are not always able to ensure that we have a secure connection.  By engaging in this virtual visit, you consent to the provision of healthcare and authorize for your insurance to be billed (if applicable) for the services provided during this visit. Depending on your insurance coverage, you may receive a charge related to this service.  I need to obtain your verbal consent now. Are you willing to proceed with your visit today? [name]. has provided verbal consent on 10/05/2023 for a virtual visit (video or telephone). Bari Learn, FNP  Date: 10/05/2023 9:22 AM   Virtual Visit via Video Note   I, Bari Learn, connected with  [name]  ([id number], Sep 10, 1965) on 10/05/23 at  9:15 AM EDT by a video-enabled telemedicine application and verified that I am speaking with the correct person using two identifiers.  Location: Patient: Virtual Visit  Location Patient: Home Provider: Virtual Visit Location Provider: Home Office   I discussed the limitations of evaluation and management by telemedicine and the availability of in person appointments. The patient expressed understanding and agreed to proceed.    History of Present Illness: Tony Finnan. is a 58 y.o. who identifies as a male who was assigned male at birth, and is being seen today for anxiety and hospital follow up. Pt was seen on 10/02/23 for dehydration, anxiety, and panic attack.   He owns his own business. He also having trouble with his sons.   He has a follow up with his PCP 10/16/23.   HPI: Anxiety Presents for follow-up visit. Symptoms include excessive worry, insomnia, nervous/anxious behavior, palpitations, panic, restlessness and shortness of breath. Symptoms occur occasionally. The severity of symptoms is causing significant distress.      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   Obesity (BMI 30.0-34.9) 04/17/2021   URI, acute 04/09/2021   Suspected COVID-19 virus infection 04/09/2021   Screening for lung cancer 05/26/2020   Acute neck pain 01/13/2020   Hypertension 01/13/2020    Allergies:  Allergies  Allergen Reactions   Ivp Dye [Iodinated Contrast Media]    Penicillins    Medications:  Current Outpatient  Medications:    escitalopram  (LEXAPRO ) 10 MG tablet, Take 1 tablet (10 mg total) by mouth daily., Disp: 90 tablet, Rfl: 1   hydrOXYzine  (VISTARIL ) 25 MG capsule, Take 1 capsule (25 mg total) by mouth every 8 (eight) hours as needed., Disp: 30 capsule, Rfl: 0   albuterol  (VENTOLIN  HFA) 108 (90 Base) MCG/ACT inhaler, TAKE 2 PUFFS BY MOUTH EVERY 6 HOURS AS NEEDED FOR WHEEZE OR SHORTNESS OF  BREATH, Disp: 8.5 each, Rfl: 1   diazepam  (VALIUM ) 5 MG tablet, Take 1 tablet (5 mg total) by mouth every 8 (eight) hours as needed for up to 14 days for anxiety., Disp: 15 tablet, Rfl: 0   fenofibrate  (TRICOR ) 48 MG tablet, TAKE 1 TABLET BY MOUTH EVERY DAY, Disp: 90 tablet, Rfl: 1   losartan -hydrochlorothiazide (HYZAAR) 50-12.5 MG tablet, TAKE 1 TABLET BY MOUTH EVERY DAY, Disp: 90 tablet, Rfl: 3   metoprolol  succinate (TOPROL -XL) 25 MG 24 hr tablet, TAKE 1 TABLET BY MOUTH EVERY DAY, Disp: 30 tablet, Rfl: 5   promethazine  (PHENERGAN ) 25 MG tablet, Take 1 tablet (25 mg total) by mouth 3 (three) times daily., Disp: 90 tablet, Rfl: 1   Semaglutide ,0.25 or 0.5MG /DOS, (OZEMPIC , 0.25 OR 0.5 MG/DOSE,) 2 MG/3ML SOPN, INJECT 0.5 MG INTO THE SKIN ONE TIME PER WEEK, Disp: 3 mL, Rfl: 3  Observations/Objective: Patient is well-developed, well-nourished in no acute distress.  Resting comfortably  at home.  Head is normocephalic, atraumatic.  No labored breathing.  Speech is clear and coherent with logical content.  Patient is alert and oriented at baseline.  Anxious  Tearful at times  Assessment and Plan: 1. GAD (generalized anxiety disorder) (Primary) - escitalopram  (LEXAPRO ) 10 MG tablet; Take 1 tablet (10 mg total) by mouth daily.  Dispense: 90 tablet; Refill: 1 - hydrOXYzine  (VISTARIL ) 25 MG capsule; Take 1 capsule (25 mg total) by mouth every 8 (eight) hours as needed.  Dispense: 30 capsule; Refill: 0  2. Panic attack - hydrOXYzine  (VISTARIL ) 25 MG capsule; Take 1 capsule (25 mg total) by mouth every 8 (eight) hours as needed.  Dispense: 30 capsule; Refill: 0  3. Hospital discharge follow-up  Will start Lexapro  10 mg today Vistaril  as needed Keep follow up with PCP, may need to increase Lexapro  dose If any SI go to ED Stress management  Hospital notes reviewed  Follow up if symptoms worsen or do not improve   Follow Up Instructions: I discussed the assessment and treatment plan with the  patient. The patient was provided an opportunity to ask questions and all were answered. The patient agreed with the plan and demonstrated an understanding of the instructions.  A copy of instructions were sent to the patient via MyChart unless otherwise noted below.    The patient was advised to call back or seek an in-person evaluation if the symptoms worsen or if the condition fails to improve as anticipated.    Bari Learn, FNP

## 2023-10-10 ENCOUNTER — Telehealth: Admitting: Emergency Medicine

## 2023-10-10 ENCOUNTER — Other Ambulatory Visit: Payer: Self-pay | Admitting: Nurse Practitioner

## 2023-10-10 DIAGNOSIS — Z760 Encounter for issue of repeat prescription: Secondary | ICD-10-CM

## 2023-10-10 MED ORDER — DIAZEPAM 5 MG PO TABS: 5.0000 mg | ORAL_TABLET | Freq: Two times a day (BID) | ORAL | 0 refills | Status: DC | PRN

## 2023-10-10 NOTE — Progress Notes (Signed)
Controlled substance database reviewed.  Medication sent to pharmacy.

## 2023-10-10 NOTE — Telephone Encounter (Signed)
 Copied from CRM [id number]. Topic: Clinical - Medication Question >> Oct 10, 2023 10:44 AM Revonda D wrote: Reason for CRM: Pt is calling to check the status of his medication that Chelsea Aurora, NP, ordered. Pt stated that he reached out to the pharmacy and they stated they don't have the medication. Pt would like to receive a callback with an update.

## 2023-10-10 NOTE — Patient Instructions (Signed)
  Tony JAYSON Clenton Mickey., thank you for joining Lamar Schlossman, PA-C for today's virtual visit.  While this provider is not your primary care provider (PCP), if your PCP is located in our provider database this encounter information will be shared with them immediately following your visit.   A [facility] MyChart account gives you access to today's visit and all your visits, tests, and labs performed at [facility]  click here if you don't have a [facility] MyChart account or go to [email or web address]  Consent: (Patient) Tony C Fleek Jr. provided verbal consent for this virtual visit at the beginning of the encounter.  Current Medications:  Current Outpatient Medications:    albuterol  (VENTOLIN  HFA) 108 (90 Base) MCG/ACT inhaler, TAKE 2 PUFFS BY MOUTH EVERY 6 HOURS AS NEEDED FOR WHEEZE OR SHORTNESS OF BREATH, Disp: 8.5 each, Rfl: 1   diazepam  (VALIUM ) 5 MG tablet, Take 1 tablet (5 mg total) by mouth every 8 (eight) hours as needed for up to 14 days for anxiety., Disp: 15 tablet, Rfl: 0   escitalopram  (LEXAPRO ) 10 MG tablet, Take 1 tablet (10 mg total) by mouth daily., Disp: 90 tablet, Rfl: 1   fenofibrate  (TRICOR ) 48 MG tablet, TAKE 1 TABLET BY MOUTH EVERY DAY, Disp: 90 tablet, Rfl: 1   hydrOXYzine  (VISTARIL ) 25 MG capsule, Take 1 capsule (25 mg total) by mouth every 8 (eight) hours as needed., Disp: 30 capsule, Rfl: 0   losartan -hydrochlorothiazide (HYZAAR) 50-12.5 MG tablet, TAKE 1 TABLET BY MOUTH EVERY DAY, Disp: 90 tablet, Rfl: 3   metoprolol  succinate (TOPROL -XL) 25 MG 24 hr tablet, TAKE 1 TABLET BY MOUTH EVERY DAY, Disp: 30 tablet, Rfl: 5   promethazine  (PHENERGAN ) 25 MG tablet, Take 1 tablet (25 mg total) by mouth 3 (three) times daily., Disp: 90 tablet, Rfl: 1   Semaglutide ,0.25 or 0.5MG /DOS, (OZEMPIC , 0.25 OR 0.5 MG/DOSE,) 2 MG/3ML SOPN, INJECT 0.5 MG INTO THE SKIN ONE TIME PER WEEK, Disp: 3 mL, Rfl: 3   Medications ordered in this encounter:  No orders of the  defined types were placed in this encounter.    *If you need refills on other medications prior to your next appointment, please contact your pharmacy*  Follow-Up: Call back or seek an in-person evaluation if the symptoms worsen or if the condition fails to improve as anticipated.  [facility] [phone]  Other Instructions Please contact your doctor's office about the valium  prescription.  This cannot be prescribed over a virtual urgent care visit.   If you have been instructed to have an in-person evaluation today at a local Urgent Care facility, please use the link below. It will take you to a list of all of our available [facility], including address, phone number and hours of operation. Please do not delay care.  [facility]  If you or a family member do not have a primary care provider, use the link below to schedule a visit and establish care. When you choose a [facility] primary care physician or advanced practice provider, you gain a long-term partner in health. Find a Primary Care Provider  Learn more about [facility]'s in-office and virtual care options: [place] - Get Care Now

## 2023-10-10 NOTE — Progress Notes (Signed)
 Virtual Visit Consent   Tony C Fillingim Jr., you are scheduled for a virtual visit with a [facility] provider today. Just as with appointments in the office, your consent must be obtained to participate. Your consent will be active for this visit and any virtual visit you may have with one of our providers in the next 365 days. If you have a MyChart account, a copy of this consent can be sent to you electronically.  As this is a virtual visit, video technology does not allow for your provider to perform a traditional examination. This may limit your provider's ability to fully assess your condition. If your provider identifies any concerns that need to be evaluated in person or the need to arrange testing (such as labs, EKG, etc.), we will make arrangements to do so. Although advances in technology are sophisticated, we cannot ensure that it will always work on either your end or our end. If the connection with a video visit is poor, the visit may have to be switched to a telephone visit. With either a video or telephone visit, we are not always able to ensure that we have a secure connection.  By engaging in this virtual visit, you consent to the provision of healthcare and authorize for your insurance to be billed (if applicable) for the services provided during this visit. Depending on your insurance coverage, you may receive a charge related to this service.  I need to obtain your verbal consent now. Are you willing to proceed with your visit today? Tony C Posadas Mickey. has provided verbal consent on 10/10/2023 for a virtual visit (video or telephone). Lamar Schlossman, PA-C  Date: 10/10/2023 10:41 AM   Virtual Visit via Video Note   I, Lamar Schlossman, connected with  [name]  ([id number], December 03, 1965) on 10/10/23 at 10:45 AM EDT by a video-enabled telemedicine application and verified that I am speaking with the correct person using two identifiers.  Location: Patient: Virtual  Visit Location Patient: Home Provider: Virtual Visit Location Provider: Home Office   I discussed the limitations of evaluation and management by telemedicine and the availability of in person appointments. The patient expressed understanding and agreed to proceed.    History of Present Illness: Tony Waterson. is a 58 y.o. who identifies as a male who was assigned male at birth, and is being seen today for anxiety and panic attacks. States that he has been trying hydroxyzine  instead of valium .  States that he talked with Dr. Vincente, who was supposed to send something in for the patient.  States that he thought his doctor was going to send in some Valium  for him.  HPI: HPI  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   Obesity (BMI 30.0-34.9) 04/17/2021   URI, acute 04/09/2021   Suspected COVID-19 virus infection 04/09/2021   Screening for lung cancer 05/26/2020   Acute neck pain 01/13/2020   Hypertension 01/13/2020    Allergies:  Allergies  Allergen Reactions   Ivp Dye [Iodinated Contrast Media]    Penicillins    Medications:  Current Outpatient Medications:    albuterol  (VENTOLIN  HFA) 108 (90 Base) MCG/ACT inhaler, TAKE 2 PUFFS BY MOUTH EVERY 6 HOURS AS NEEDED FOR WHEEZE OR SHORTNESS OF BREATH,  Disp: 8.5 each, Rfl: 1   diazepam  (VALIUM ) 5 MG tablet, Take 1 tablet (5 mg total) by mouth every 8 (eight) hours as needed for up to 14 days for anxiety., Disp: 15 tablet, Rfl: 0   escitalopram  (LEXAPRO ) 10 MG tablet, Take 1 tablet (10 mg total) by mouth daily., Disp: 90 tablet, Rfl: 1   fenofibrate  (TRICOR ) 48 MG tablet, TAKE 1 TABLET BY MOUTH EVERY DAY, Disp: 90 tablet, Rfl: 1   hydrOXYzine  (VISTARIL ) 25 MG capsule, Take 1 capsule (25 mg  total) by mouth every 8 (eight) hours as needed., Disp: 30 capsule, Rfl: 0   losartan -hydrochlorothiazide (HYZAAR) 50-12.5 MG tablet, TAKE 1 TABLET BY MOUTH EVERY DAY, Disp: 90 tablet, Rfl: 3   metoprolol  succinate (TOPROL -XL) 25 MG 24 hr tablet, TAKE 1 TABLET BY MOUTH EVERY DAY, Disp: 30 tablet, Rfl: 5   promethazine  (PHENERGAN ) 25 MG tablet, Take 1 tablet (25 mg total) by mouth 3 (three) times daily., Disp: 90 tablet, Rfl: 1   Semaglutide ,0.25 or 0.5MG /DOS, (OZEMPIC , 0.25 OR 0.5 MG/DOSE,) 2 MG/3ML SOPN, INJECT 0.5 MG INTO THE SKIN ONE TIME PER WEEK, Disp: 3 mL, Rfl: 3  Observations/Objective: Patient is well-developed, well-nourished in no acute distress.  Resting comfortably  at home.  Head is normocephalic, atraumatic.  No labored breathing.  Speech is clear and coherent with logical content.  Patient is alert and oriented at baseline.    Assessment and Plan: 1. Medication refill (Primary)  Patient presenting with recurrent panic attacks.  States that he was recently switched off of Valium  and started on hydroxyzine .  Reports no improvement.  States that his doctor told him he they would send in for Valium , but has not done so.  I informed the patient that I cannot prescribe a benzodiazepine through a virtual urgent care visit and the patient will need to contact his doctor's office.  Patient elected to terminate the visit since we are not able to help him with this request.  Follow Up Instructions: I discussed the assessment and treatment plan with the patient. The patient was provided an opportunity to ask questions and all were answered. The patient agreed with the plan and demonstrated an understanding of the instructions.  A copy of instructions were sent to the patient via MyChart unless otherwise noted below.     The patient was advised to call back or seek an in-person evaluation if the symptoms worsen or if the condition fails to improve as anticipated.    Lamar Schlossman,  PA-C

## 2023-10-10 NOTE — Telephone Encounter (Signed)
  [facility] [place]

## 2023-10-16 ENCOUNTER — Ambulatory Visit: Admitting: Nurse Practitioner

## 2023-10-16 ENCOUNTER — Encounter: Payer: Self-pay | Admitting: Nurse Practitioner

## 2023-10-16 ENCOUNTER — Ambulatory Visit: Attending: Nurse Practitioner

## 2023-10-16 VITALS — BP 142/92 | HR 89 | Temp 98.6°F | Ht 63.0 in | Wt 160.8 lb

## 2023-10-16 DIAGNOSIS — E785 Hyperlipidemia, unspecified: Secondary | ICD-10-CM

## 2023-10-16 DIAGNOSIS — I10 Essential (primary) hypertension: Secondary | ICD-10-CM | POA: Diagnosis not present

## 2023-10-16 DIAGNOSIS — R079 Chest pain, unspecified: Secondary | ICD-10-CM

## 2023-10-16 DIAGNOSIS — F410 Panic disorder [episodic paroxysmal anxiety] without agoraphobia: Secondary | ICD-10-CM

## 2023-10-16 MED ORDER — BUSPIRONE HCL 5 MG PO TABS: 5.0000 mg | ORAL_TABLET | Freq: Two times a day (BID) | ORAL | 1 refills | Status: DC

## 2023-10-16 NOTE — Progress Notes (Signed)
 Established Patient Office Visit  Subjective:  Patient ID: Tony Castile., male    DOB: December 25, 1965  Age: 58 y.o. MRN: [id number]  CC:  Chief Complaint  Patient presents with   Follow-up    Ed Follow up for Heat Exhaustion   Discussed the use of a AI scribe software for clinical note transcription with the patient, who gave verbal consent to proceed.  HPI  Tony Radin. presents for ED follow up.  He experiences recurrent episodes of chest discomfort and syncope, with the latest episode this morning. The chest discomfort feels like being 'grabbed in the chest' and is accompanied by nausea, vomiting, and tunnel vision. These episodes have been occurring for several weeks, with a significant incident last Thursday leading to an ER visit.  He experiences stress and anxiety due to work and family issues, including concerns about his sons. Panic attacks occur multiple times a day, with symptoms of a racing pulse, chest tightness, nausea, vomiting, and syncope. He takes Lexapro , which is starting to help, and Valium  twice daily. Hydroxyzine  was too sedating. He regularly takes losartan  and metoprolol  for blood pressure.  He reports an eight-pound weight loss over the past week. He does not smoke. He has used opiates in the past for a neck injury and has a history of claustrophobia requiring low-dose Valium  for procedures.  HPI   Past Medical History:  Diagnosis Date   Brain tumor (benign) (HCC)    Hypertension     Past Surgical History:  Procedure Laterality Date   VENTRICULOPERITONEAL SHUNT      Family History  Problem Relation Age of Onset   Kidney cancer Father    Hyperlipidemia Paternal Grandmother    Hypertension Paternal Grandmother    Stroke Paternal Grandfather    Heart failure Paternal Grandfather    Stroke Paternal Uncle    Heart failure Paternal Uncle    Early death Paternal Uncle     Social History   Socioeconomic History   Marital status:  Married    Spouse name: Not on file   Number of children: Not on file   Years of education: Not on file   Highest education level: Not on file  Occupational History   Not on file  Tobacco Use   Smoking status: Former    Current packs/day: 0.00    Types: Cigarettes    Quit date: 2014    Years since quitting: 11.6   Smokeless tobacco: Never  Substance and Sexual Activity   Alcohol  use: Not Currently   Drug use: Never   Sexual activity: Yes  Other Topics Concern   Not on file  Social History Narrative   Not on file   Social Drivers of Health   Financial Resource Strain: Low Risk  (10/02/2023)   Received from [facility]   Overall Financial Resource Strain (CARDIA)    Difficulty of Paying Living Expenses: Not hard at all  Food Insecurity: No Food Insecurity (10/02/2023)   Received from [facility]   Hunger Vital Sign    Within the past 12 months, you worried that your food would run out before you got the money to buy more.: Never true    Within the past 12 months, the food you bought just didn't last and you didn't have money to get more.: Never true  Transportation Needs: No Transportation Needs (10/02/2023)   Received from [facility]   [facility] - Transportation  In the past 12 months, has lack of transportation kept you from medical appointments or from getting medications?: No    Lack of Transportation (Non-Medical): No  Physical Activity: Not on file  Stress: Not on file  Social Connections: Not on file  Intimate Partner Violence: Not on file     Outpatient Medications Prior to Visit  Medication Sig Dispense Refill   albuterol  (VENTOLIN  HFA) 108 (90 Base) MCG/ACT inhaler TAKE 2 PUFFS BY MOUTH EVERY 6 HOURS AS NEEDED FOR WHEEZE OR SHORTNESS OF BREATH 8.5 each 1   diazepam  (VALIUM ) 5 MG tablet Take 1 tablet (5 mg total) by mouth every 12 (twelve) hours as needed for anxiety. 10 tablet 0   escitalopram  (LEXAPRO ) 10  MG tablet Take 1 tablet (10 mg total) by mouth daily. 90 tablet 1   fenofibrate  (TRICOR ) 48 MG tablet TAKE 1 TABLET BY MOUTH EVERY DAY 90 tablet 1   losartan -hydrochlorothiazide (HYZAAR) 50-12.5 MG tablet TAKE 1 TABLET BY MOUTH EVERY DAY 90 tablet 3   metoprolol  succinate (TOPROL -XL) 25 MG 24 hr tablet TAKE 1 TABLET BY MOUTH EVERY DAY 30 tablet 5   promethazine  (PHENERGAN ) 25 MG tablet Take 1 tablet (25 mg total) by mouth 3 (three) times daily. 90 tablet 1   Semaglutide ,0.25 or 0.5MG /DOS, (OZEMPIC , 0.25 OR 0.5 MG/DOSE,) 2 MG/3ML SOPN INJECT 0.5 MG INTO THE SKIN ONE TIME PER WEEK 3 mL 3   hydrOXYzine  (VISTARIL ) 25 MG capsule Take 1 capsule (25 mg total) by mouth every 8 (eight) hours as needed. 30 capsule 0   No facility-administered medications prior to visit.    Allergies  Allergen Reactions   Ivp Dye [Iodinated Contrast Media]    Penicillins     ROS Review of Systems Negative unless indicated in HPI.    Objective:    Physical Exam Constitutional:      Appearance: Normal appearance.  HENT:     Mouth/Throat:     Mouth: Mucous membranes are moist.  Eyes:     Conjunctiva/sclera: Conjunctivae normal.     Pupils: Pupils are equal, round, and reactive to light.  Cardiovascular:     Rate and Rhythm: Normal rate and regular rhythm.     Pulses: Normal pulses.     Heart sounds: Normal heart sounds.  Pulmonary:     Effort: Pulmonary effort is normal.     Breath sounds: Normal breath sounds.  Abdominal:     General: Bowel sounds are normal.     Palpations: Abdomen is soft.  Musculoskeletal:     Cervical back: Normal range of motion. No tenderness.  Skin:    General: Skin is warm.     Findings: No bruising.  Neurological:     General: No focal deficit present.     Mental Status: He is alert and oriented to person, place, and time. Mental status is at baseline.  Psychiatric:        Mood and Affect: Mood is anxious.        Behavior: Behavior normal.        Thought Content:  Thought content normal.        Judgment: Judgment normal.     BP (!) 142/92   Pulse 89   Temp 98.6 F (37 C)   Ht 5' 3 (1.6 m)   Wt 160 lb 12.8 oz (72.9 kg)   SpO2 98%   BMI 28.48 kg/m  Wt Readings from Last 3 Encounters:  10/16/23 160 lb 12.8 oz (72.9 kg)  10/02/23  167 lb (75.8 kg)  04/22/23 186 lb 12.8 oz (84.7 kg)     Health Maintenance  Topic Date Due   OPHTHALMOLOGY EXAM  Never done   HIV Screening  Never done   Diabetic kidney evaluation - Urine ACR  Never done   Hepatitis C Screening  Never done   DTaP/Tdap/Td (1 - Tdap) Never done   Pneumococcal Vaccine: 19-49 Years (1 of 2 - PCV) Never done   Pneumococcal Vaccine: 50+ Years (1 of 2 - PCV) Never done   Hepatitis B Vaccines (1 of 3 - 19+ 3-dose series) Never done   Zoster Vaccines- Shingrix (1 of 2) Never done   Colonoscopy  03/01/2023   INFLUENZA VACCINE  10/17/2023   HEMOGLOBIN A1C  10/20/2023   COVID-19 Vaccine (1 - 2024-25 season) 10/31/2023 (Originally 11/17/2022)   FOOT EXAM  04/21/2024   Diabetic kidney evaluation - eGFR measurement  10/01/2024   HPV VACCINES  Aged Out   Meningococcal B Vaccine  Aged Out       Topic Date Due   Hepatitis B Vaccines (1 of 3 - 19+ 3-dose series) Never done    Lab Results  Component Value Date   TSH 1.12 04/22/2023   Lab Results  Component Value Date   WBC 12.1 (H) 10/02/2023   HGB 16.7 10/02/2023   HCT 47.3 10/02/2023   MCV 84.6 10/02/2023   PLT 279 10/02/2023   Lab Results  Component Value Date   NA 133 (L) 10/02/2023   K 4.5 10/02/2023   CO2 24 10/02/2023   GLUCOSE 84 10/02/2023   BUN 18 10/02/2023   CREATININE 1.75 (H) 10/02/2023   BILITOT 0.5 04/22/2023   ALKPHOS 72 04/22/2023   AST 18 04/22/2023   ALT 28 04/22/2023   PROT 7.2 04/22/2023   ALBUMIN 4.7 04/22/2023   CALCIUM 10.5 (H) 10/02/2023   ANIONGAP 16 (H) 10/02/2023   EGFR 94 03/01/2022   GFR 66.76 04/22/2023   Lab Results  Component Value Date   CHOL 226 (H) 04/22/2023   Lab Results   Component Value Date   HDL 37.00 (L) 04/22/2023   Lab Results  Component Value Date   LDLCALC 144 (H) 03/01/2022   Lab Results  Component Value Date   TRIG (H) 04/22/2023    568.0 Triglyceride is over 400; calculations on Lipids are invalid.   Lab Results  Component Value Date   CHOLHDL 6 04/22/2023   Lab Results  Component Value Date   HGBA1C 6.1 04/22/2023      Assessment & Plan:  Intermittent chest pain Assessment & Plan: Intermittent chest pain with syncope, nausea and palpitation. EKG sinus rhythm.  -Will place of Zio monitor. - Refer to cardiology for further evaluation of chest pain due risk factor hypertension, hyperlipidemia and strong family history of heart disease.   Orders: -     EKG 12-Lead -     LONG TERM MONITOR (3-14 DAYS); Future -     Ambulatory referral to Cardiology  Hypertension, unspecified type -     CBC; Future -     Magnesium; Future -     TSH; Future -     Comprehensive metabolic panel with GFR  Hyperlipidemia, unspecified hyperlipidemia type -     Lipid panel; Future  Panic attack Assessment & Plan: Recurrent panic attacks with chest discomfort, syncope, nausea, and vomiting. Lexapro  effective. Valium  discontinued due to dependency risk. Hydroxyzine  poorly tolerated. - Refer to psychiatrist for further evaluation and management. - Initiate  Buspar  5 mg for anxiety management.  Orders: -     Ambulatory referral to Psychiatry  Other orders -     busPIRone  HCl; Take 1 tablet (5 mg total) by mouth 2 (two) times daily.  Dispense: 60 tablet; Refill: 1    Follow-up: Return in about 1 month (around 11/16/2023) for anxiety.   Cammie Faulstich, NP

## 2023-10-17 ENCOUNTER — Other Ambulatory Visit

## 2023-10-20 NOTE — Telephone Encounter (Signed)
 Patient is scheduled for labs on 10/29/23 at 8:15.

## 2023-10-23 ENCOUNTER — Ambulatory Visit: Payer: Commercial Managed Care - PPO | Admitting: Nurse Practitioner

## 2023-10-27 DIAGNOSIS — R079 Chest pain, unspecified: Secondary | ICD-10-CM | POA: Insufficient documentation

## 2023-10-27 DIAGNOSIS — F410 Panic disorder [episodic paroxysmal anxiety] without agoraphobia: Secondary | ICD-10-CM | POA: Insufficient documentation

## 2023-10-27 NOTE — Assessment & Plan Note (Addendum)
 Intermittent chest pain with syncope, nausea and palpitation. EKG sinus rhythm.  -Will place of Zio monitor. - Refer to cardiology for further evaluation of chest pain due risk factor hypertension, hyperlipidemia and strong family history of heart disease.

## 2023-10-27 NOTE — Assessment & Plan Note (Signed)
 Recurrent panic attacks with chest discomfort, syncope, nausea, and vomiting. Lexapro  effective. Valium  discontinued due to dependency risk. Hydroxyzine  poorly tolerated. - Refer to psychiatrist for further evaluation and management. - Initiate Buspar  5 mg for anxiety management.

## 2023-10-29 ENCOUNTER — Other Ambulatory Visit (INDEPENDENT_AMBULATORY_CARE_PROVIDER_SITE_OTHER)

## 2023-10-29 DIAGNOSIS — E785 Hyperlipidemia, unspecified: Secondary | ICD-10-CM | POA: Diagnosis not present

## 2023-10-29 DIAGNOSIS — I10 Essential (primary) hypertension: Secondary | ICD-10-CM | POA: Diagnosis not present

## 2023-10-29 LAB — CBC
HCT: 44.2 % (ref 39.0–52.0)
Hemoglobin: 15 g/dL (ref 13.0–17.0)
MCHC: 33.9 g/dL (ref 30.0–36.0)
MCV: 87 fl (ref 78.0–100.0)
Platelets: 241 K/uL (ref 150.0–400.0)
RBC: 5.08 Mil/uL (ref 4.22–5.81)
RDW: 14.2 % (ref 11.5–15.5)
WBC: 5.4 K/uL (ref 4.0–10.5)

## 2023-10-29 LAB — LIPID PANEL
Cholesterol: 159 mg/dL (ref 0–200)
HDL: 42.5 mg/dL (ref >39.00)
LDL Cholesterol: 83 mg/dL (ref 0–99)
NonHDL: 116.02
Total CHOL/HDL Ratio: 4
Triglycerides: 166 mg/dL — ABNORMAL HIGH (ref 0.0–149.0)
VLDL: 33.2 mg/dL (ref 0.0–40.0)

## 2023-10-29 LAB — TSH: TSH: 0.71 u[IU]/mL (ref 0.35–5.50)

## 2023-10-29 NOTE — Addendum Note (Signed)
 Addended by: VINCENTE SABER on: 10/29/2023 09:49 PM   Modules accepted: Orders

## 2023-10-30 ENCOUNTER — Encounter: Payer: Self-pay | Admitting: Nurse Practitioner

## 2023-10-30 ENCOUNTER — Other Ambulatory Visit: Payer: Self-pay

## 2023-10-30 DIAGNOSIS — F411 Generalized anxiety disorder: Secondary | ICD-10-CM

## 2023-10-30 DIAGNOSIS — F410 Panic disorder [episodic paroxysmal anxiety] without agoraphobia: Secondary | ICD-10-CM

## 2023-10-30 LAB — MAGNESIUM: Magnesium: 2.2 mg/dL (ref 1.5–2.5)

## 2023-10-30 NOTE — Telephone Encounter (Signed)
 Noted

## 2023-10-30 NOTE — Telephone Encounter (Signed)
 Can pt get an update on referral for psychology

## 2023-11-02 ENCOUNTER — Ambulatory Visit: Payer: Self-pay | Admitting: Nurse Practitioner

## 2023-11-03 ENCOUNTER — Telehealth: Admitting: Physician Assistant

## 2023-11-03 DIAGNOSIS — F411 Generalized anxiety disorder: Secondary | ICD-10-CM

## 2023-11-03 NOTE — Patient Instructions (Signed)
  Rutha JAYSON Clenton Mickey., thank you for joining Teena Shuck, PA-C for today's virtual visit.  While this provider is not your primary care provider (PCP), if your PCP is located in our provider database this encounter information will be shared with them immediately following your visit.   A [place] MyChart account gives you access to today's visit and all your visits, tests, and labs performed at [facility]  click here if you don't have a [facility] MyChart account or go to [email or web address]  Consent: (Patient) Tony C Sweetland Jr. provided verbal consent for this virtual visit at the beginning of the encounter.  Current Medications:  Current Outpatient Medications:    albuterol  (VENTOLIN  HFA) 108 (90 Base) MCG/ACT inhaler, TAKE 2 PUFFS BY MOUTH EVERY 6 HOURS AS NEEDED FOR WHEEZE OR SHORTNESS OF BREATH, Disp: 8.5 each, Rfl: 1   busPIRone  (BUSPAR ) 5 MG tablet, Take 1 tablet (5 mg total) by mouth 2 (two) times daily., Disp: 60 tablet, Rfl: 1   diazepam  (VALIUM ) 5 MG tablet, Take 1 tablet (5 mg total) by mouth every 12 (twelve) hours as needed for anxiety., Disp: 10 tablet, Rfl: 0   escitalopram  (LEXAPRO ) 10 MG tablet, Take 1 tablet (10 mg total) by mouth daily., Disp: 90 tablet, Rfl: 1   fenofibrate  (TRICOR ) 48 MG tablet, TAKE 1 TABLET BY MOUTH EVERY DAY, Disp: 90 tablet, Rfl: 1   losartan -hydrochlorothiazide (HYZAAR) 50-12.5 MG tablet, TAKE 1 TABLET BY MOUTH EVERY DAY, Disp: 90 tablet, Rfl: 3   metoprolol  succinate (TOPROL -XL) 25 MG 24 hr tablet, TAKE 1 TABLET BY MOUTH EVERY DAY, Disp: 30 tablet, Rfl: 5   promethazine  (PHENERGAN ) 25 MG tablet, Take 1 tablet (25 mg total) by mouth 3 (three) times daily., Disp: 90 tablet, Rfl: 1   Semaglutide ,0.25 or 0.5MG /DOS, (OZEMPIC , 0.25 OR 0.5 MG/DOSE,) 2 MG/3ML SOPN, INJECT 0.5 MG INTO THE SKIN ONE TIME PER WEEK, Disp: 3 mL, Rfl: 3   Medications ordered in this encounter:  No orders of the defined types were placed in this  encounter.    *If you need refills on other medications prior to your next appointment, please contact your pharmacy*  Follow-Up: Call back or seek an in-person evaluation if the symptoms worsen or if the condition fails to improve as anticipated.  [facility] [phone]  Other Instructions Report to [facility] Contact Us  Phone [phone]  Address [address] [place], [place] [address]  Hours Open 24/7. No appointment required.   If you have been instructed to have an in-person evaluation today at a local Urgent Care facility, please use the link below. It will take you to a list of all of our available [facility], including address, phone number and hours of operation. Please do not delay care.  [facility]  If you or a family member do not have a primary care provider, use the link below to schedule a visit and establish care. When you choose a [facility] primary care physician or advanced practice provider, you gain a long-term partner in health. Find a Primary Care Provider  Learn more about [place]'s in-office and virtual care options:  - Get Care Now

## 2023-11-03 NOTE — Progress Notes (Signed)
 Virtual Visit Consent   Tony C Bertram Jr., you are scheduled for a virtual visit with a [facility] provider today. Just as with appointments in the office, your consent must be obtained to participate. Your consent will be active for this visit and any virtual visit you may have with one of our providers in the next 365 days. If you have a MyChart account, a copy of this consent can be sent to you electronically.  As this is a virtual visit, video technology does not allow for your provider to perform a traditional examination. This may limit your provider's ability to fully assess your condition. If your provider identifies any concerns that need to be evaluated in person or the need to arrange testing (such as labs, EKG, etc.), we will make arrangements to do so. Although advances in technology are sophisticated, we cannot ensure that it will always work on either your end or our end. If the connection with a video visit is poor, the visit may have to be switched to a telephone visit. With either a video or telephone visit, we are not always able to ensure that we have a secure connection.  By engaging in this virtual visit, you consent to the provision of healthcare and authorize for your insurance to be billed (if applicable) for the services provided during this visit. Depending on your insurance coverage, you may receive a charge related to this service.  I need to obtain your verbal consent now. Are you willing to proceed with your visit today? Tony C Cino Mickey. has provided verbal consent on 11/03/2023 for a virtual visit (video or telephone). Teena Shuck, [place]  Date: 11/03/2023 5:35 PM   Virtual Visit via Video Note   I, Teena Shuck, connected with  [name]  ([id number], [birth year]) on 11/03/23 at  5:30 PM EDT by a video-enabled telemedicine application and verified that I am speaking with the correct person using two identifiers.  Location: Patient: Virtual Visit  Location Patient: Home Provider: Virtual Visit Location Provider: Home Office   I discussed the limitations of evaluation and management by telemedicine and the availability of in person appointments. The patient expressed understanding and agreed to proceed.    History of Present Illness: Tony Matters. is a 58 y.o. who identifies as a male who was assigned male at birth, and is being seen today for anxiety.  HPI: Anxiety Presents for initial visit. Onset was 1 to 4 weeks ago. The problem has been waxing and waning. Symptoms include chest pain and excessive worry. The symptoms are aggravated by family issues.   Risk factors include a major life event and family history. Past treatments include lifestyle changes.    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/30/2021   Obesity (BMI 30.0-34.9) 04/17/2021   URI, acute 04/09/2021   Suspected COVID-19 virus infection 04/09/2021   Screening for lung cancer 05/26/2020   Acute neck pain 01/13/2020   Hypertension 01/13/2020    Allergies:  Allergies  Allergen Reactions   Ivp Dye [Iodinated Contrast Media]    Penicillins    Medications:  Current Outpatient Medications:    albuterol  (VENTOLIN  HFA) 108 (90 Base) MCG/ACT inhaler,  TAKE 2 PUFFS BY MOUTH EVERY 6 HOURS AS NEEDED FOR WHEEZE OR SHORTNESS OF BREATH, Disp: 8.5 each, Rfl: 1   busPIRone  (BUSPAR ) 5 MG tablet, Take 1 tablet (5 mg total) by mouth 2 (two) times daily., Disp: 60 tablet, Rfl: 1   diazepam  (VALIUM ) 5 MG tablet, Take 1 tablet (5 mg total) by mouth every 12 (twelve) hours as needed for anxiety., Disp: 10 tablet, Rfl: 0   escitalopram  (LEXAPRO ) 10 MG tablet, Take 1  tablet (10 mg total) by mouth daily., Disp: 90 tablet, Rfl: 1   fenofibrate  (TRICOR ) 48 MG tablet, TAKE 1 TABLET BY MOUTH EVERY DAY, Disp: 90 tablet, Rfl: 1   losartan -hydrochlorothiazide (HYZAAR) 50-12.5 MG tablet, TAKE 1 TABLET BY MOUTH EVERY DAY, Disp: 90 tablet, Rfl: 3   metoprolol  succinate (TOPROL -XL) 25 MG 24 hr tablet, TAKE 1 TABLET BY MOUTH EVERY DAY, Disp: 30 tablet, Rfl: 5   promethazine  (PHENERGAN ) 25 MG tablet, Take 1 tablet (25 mg total) by mouth 3 (three) times daily., Disp: 90 tablet, Rfl: 1   Semaglutide ,0.25 or 0.5MG /DOS, (OZEMPIC , 0.25 OR 0.5 MG/DOSE,) 2 MG/3ML SOPN, INJECT 0.5 MG INTO THE SKIN ONE TIME PER WEEK, Disp: 3 mL, Rfl: 3  Observations/Objective: Patient is well-developed, well-nourished in no acute distress.  Resting comfortably  at home.  Head is normocephalic, atraumatic.  No labored breathing.  Speech is clear and coherent with logical content.  Patient is alert and oriented at baseline.    Assessment and Plan: 1. GAD (generalized anxiety disorder) (Primary)  Patient without SI/HI, advised to present to [facility] for medication adjustment. No threat to himself or anyone else. He verbalized understanding and agreement with this plan.  Follow Up Instructions: I discussed the assessment and treatment plan with the patient. The patient was provided an opportunity to ask questions and all were answered. The patient agreed with the plan and demonstrated an understanding of the instructions.  A copy of instructions were sent to the patient via MyChart unless otherwise noted below.    The patient was advised to call back or seek an in-person evaluation if the symptoms worsen or if the condition fails to improve as anticipated.    Teena Shuck, PA-C

## 2023-11-04 ENCOUNTER — Other Ambulatory Visit: Payer: Self-pay | Admitting: Nurse Practitioner

## 2023-11-04 MED ORDER — BUSPIRONE HCL 10 MG PO TABS: 10.0000 mg | ORAL_TABLET | Freq: Two times a day (BID) | ORAL | 0 refills | Status: DC

## 2023-11-04 NOTE — Progress Notes (Signed)
 We can cancel the appointment with [facility] tomorrow. He is scheduled to see psych tomorrow.

## 2023-11-04 NOTE — Telephone Encounter (Signed)
 Copied from CRM #[id number]. Topic: Appointments - Scheduling Inquiry for Clinic >> Nov 04, 2023  8:54 AM Berneda FALCON wrote: Reason for CRM: Pt would like to discuss the medications he is on for anxiety. States it helps but not enough, he is on edge and sometimes snaps on wife, employees, and customers. I was trying to schedule him with [facility], but she doesn't have anything until 9/11. Is there any way we can get him in any sooner or can he see another provider until then?  Patient callback is [phone]  I scheduled an appointment for patient to see Leron Glance, FNP-C, tomorrow.

## 2023-11-05 ENCOUNTER — Ambulatory Visit (INDEPENDENT_AMBULATORY_CARE_PROVIDER_SITE_OTHER): Admitting: Psychiatry

## 2023-11-05 ENCOUNTER — Encounter: Payer: Self-pay | Admitting: Psychiatry

## 2023-11-05 ENCOUNTER — Ambulatory Visit: Admitting: Nurse Practitioner

## 2023-11-05 VITALS — BP 128/84 | HR 103 | Temp 98.8°F | Ht 63.0 in | Wt 162.2 lb

## 2023-11-05 DIAGNOSIS — G4700 Insomnia, unspecified: Secondary | ICD-10-CM | POA: Insufficient documentation

## 2023-11-05 DIAGNOSIS — F411 Generalized anxiety disorder: Secondary | ICD-10-CM | POA: Diagnosis not present

## 2023-11-05 DIAGNOSIS — F32A Depression, unspecified: Secondary | ICD-10-CM | POA: Insufficient documentation

## 2023-11-05 DIAGNOSIS — F1111 Opioid abuse, in remission: Secondary | ICD-10-CM | POA: Diagnosis not present

## 2023-11-05 MED ORDER — TRAZODONE HCL 50 MG PO TABS: 25.0000 mg | ORAL_TABLET | Freq: Every evening | ORAL | 1 refills | Status: DC | PRN

## 2023-11-05 NOTE — Patient Instructions (Addendum)
 Please see below for psychotherapy :  [email or web address]  [email or web address]   [employer] [email or web address] [address], [place], [place] [address]  [phone]  [facility] [email or web address] [address], [place], [place] [address]  [phone]   [facility] - [phone]  [facility] - [phone]  [facility] - [phone] counseling [phone]  [facility] - [phone]     [facility] [address] [place], [place] [address]  [phone]    Belvie Chancy [address] [place], [place] [address]  [phone]    [facility] [address] [place], [place] [address]  [phone]     Trazodone  Tablets What is this medication? TRAZODONE  (TRAZ oh done) treats depression. It increases the amount of serotonin in the brain, a substance that helps regulate mood. This medicine may be used for other purposes; ask your health care provider or pharmacist if you have questions. COMMON BRAND NAME(S): Desyrel  What should I tell my care team before I take this medication? They need to know if you have any of these conditions: Bipolar disorder Bleeding disorder Glaucoma Heart disease, or previous heart attack Irregular heartbeat or rhythm Kidney disease Liver disease Low levels of sodium in the blood Suicidal thoughts, plans, or attempt by you or a family member An unusual or allergic reaction to trazodone , other medications, foods, dyes, or preservatives Pregnant or trying to get pregnant Breastfeeding How should I use this medication? Take this medication by mouth with a glass of water. Take it as directed on the prescription label at the same time every day. Take this medication shortly after a meal or a light snack. Keep taking this medication unless  your care team tells you to stop. Stopping it too quickly can cause serious side effects. It can also make your condition worse. A special MedGuide will be given to you by the pharmacist with each prescription and refill. Be sure to read this information carefully each time. Talk to your care team about the use of this medication in children. Special care may be needed. Overdosage: If you think you have taken too much of this medicine contact a poison control center or emergency room at once. NOTE: This medicine is only for you. Do not share this medicine with others. What if I miss a dose? If you miss a dose, take it as soon as you can. If it is almost time for your next dose, take only that dose. Do not take double or extra doses. What may interact with this medication? Do not take this medication with any of the following: Certain medications for fungal infections, such as fluconazole, itraconazole, ketoconazole, posaconazole, voriconazole Cisapride Dronedarone Linezolid MAOIs, such as Carbex, Eldepryl, Marplan, Nardil, and Parnate Mesoridazine Methylene blue (injected into a vein) Pimozide Saquinavir Thioridazine This medication may also interact with the following: Alcohol  Antiviral medications for HIV or AIDS Aspirin and aspirin-like medications Barbiturates, such as phenobarbital Certain medications for blood pressure, heart disease, irregular heart beat Certain medications for mental health conditions Certain medications for migraine headache, such as almotriptan, eletriptan, frovatriptan, naratriptan, rizatriptan, sumatriptan, zolmitriptan Certain medications for seizures, such as carbamazepine and phenytoin Certain medications for sleep Certain medications that treat or prevent blood clots, such as dalteparin, enoxaparin, warfarin Digoxin Fentanyl  Lithium NSAIDS, medications for pain and inflammation, such as ibuprofen or naproxen Other medications  that cause heart rhythm  changes Rasagiline Supplements, such as St. John's wort, kava kava, valerian Tramadol Tryptophan This list may not describe all possible interactions. Give your health care provider a list of all the medicines, herbs, non-prescription drugs, or dietary supplements you use. Also tell them if you smoke, drink alcohol , or use illegal drugs. Some items may interact with your medicine. What should I watch for while using this medication? Visit your care team for regular checks on your progress. Tell your care team if your symptoms do not start to get better or if they get worse. Because it may take several weeks to see the full effects of this medication, it is important to continue your treatment as prescribed by your care team. Watch for new or worsening thoughts of suicide or depression. This includes sudden changes in mood, behaviors, or thoughts. These changes can happen at any time but are more common in the beginning of treatment or after a change in dose. Call your care team right away if you experience these thoughts or worsening depression. This medication may cause mood and behavior changes, such as anxiety, nervousness, irritability, hostility, restlessness, excitability, hyperactivity, or trouble sleeping. These changes can happen at any time but are more common in the beginning of treatment or after a change in dose. Call your care team right away if you notice any of these symptoms. This medication may affect your coordination, reaction time, or judgment. Do not drive or operate machinery until you know how this medication affects you. Sit up or stand slowly to reduce the risk of dizzy or fainting spells. Drinking alcohol  with this medication can increase the risk of these side effects. This medication may cause dry eyes and blurred vision. If you wear contact lenses you may feel some discomfort. Lubricating drops may help. See your care team if the problem does not go away or is severe. Your  mouth may get dry. Chewing sugarless gum or sucking hard candy and drinking plenty of water may help. Contact your care team if the problem does not go away or is severe. What side effects may I notice from receiving this medication? Side effects that you should report to your care team as soon as possible: Allergic reactions--skin rash, itching, hives, swelling of the face, lips, tongue, or throat Bleeding--bloody or black, tar-like stools, red or dark brown urine, vomiting blood or brown material that looks like coffee grounds, small, red or purple spots on skin, unusual bleeding or bruising Heart rhythm changes--fast or irregular heartbeat, dizziness, feeling faint or lightheaded, chest pain, trouble breathing Low blood pressure--dizziness, feeling faint or lightheaded, blurry vision Low sodium level--muscle weakness, fatigue, dizziness, headache, confusion Prolonged or painful erection Serotonin syndrome--irritability, confusion, fast or irregular heartbeat, muscle stiffness, twitching muscles, sweating, high fever, seizures, chills, vomiting, diarrhea Sudden eye pain or change in vision such as blurry vision, seeing halos around lights, vision loss Thoughts of suicide or self-harm, worsening mood, feelings of depression Side effects that usually do not require medical attention (report to your care team if they continue or are bothersome): Change in sex drive or performance Constipation Dizziness Drowsiness Dry mouth This list may not describe all possible side effects. Call your doctor for medical advice about side effects. You may report side effects to FDA at 1-800-FDA-1088. Where should I keep my medication? Keep out of the reach of children and pets. Store at room temperature between 15 and 30 degrees C (59 to 86 degrees F). Protect from light.  Keep container tightly closed. Throw away any unused medication after the expiration date. NOTE: This sheet is a summary. It may not cover all  possible information. If you have questions about this medicine, talk to your doctor, pharmacist, or health care provider.  2025 Elsevier/Gold Standard (2023-05-21 00:00:00)Serotonin Syndrome Serotonin is a chemical that helps to control several functions in the body. This chemical is also called a neurotransmitter. It controls: Brain and nerve cell function. Mood and emotions. Memory. Eating. Sleeping. Sexual activity. Stress response. Having too much serotonin in your body can cause serotonin syndrome. This condition can be harmful to your brain and nerve cells. This can be a life-threatening condition. What are the causes? This condition may be caused by taking medicines or drugs that increase the level of serotonin in your body, such as: Antidepressant medicines. Migraine medicines. Certain pain medicines. Certain drugs, including ecstasy, LSD, cocaine , and amphetamines. Over-the-counter cough or cold medicines that contain dextromethorphan. Certain herbal supplements, including St. John's wort, ginseng, and nutmeg. This condition usually occurs when you take these medicines or drugs together, but it can also happen with a high dose of a single medicine or drug. What increases the risk? You are more likely to develop this condition if: You just started taking a medicine or drug that increases the level of serotonin in the body. You recently increased the dose of a medicine or drug that increases the level of serotonin in the body. You take more than one medicine or drug that increases the level of serotonin in the body. What are the signs or symptoms? Symptoms of this condition usually start within several hours of taking a medicine or drug. Symptoms may be mild or severe. Mild symptoms include: Sweating. Restlessness or agitation. Muscle twitching or stiffness. Rapid heart rate. Nausea, vomiting, or diarrhea. Shivering or goose bumps. Confusion. Severe symptoms  include: Irregular heartbeat. Seizures. Loss of consciousness. High fever. How is this diagnosed? This condition may be diagnosed based on: Your medical history. A physical exam. Your prior use of drugs and medicines. Blood or urine tests. These may be used to rule out other causes of your symptoms. How is this treated? The treatment for this condition depends on the severity of your symptoms. For mild cases, stopping the medicine or drug that caused your condition is usually all that is needed. For moderate to severe cases, treatment in a hospital may be needed to prevent or treat life-threatening symptoms. Treatment may include: Medicines to control your symptoms. IV fluids. Actions to support your breathing. Treatments to control your body temperature. Follow these instructions at home: Medicines  Take over-the-counter and prescription medicines only as told by your health care provider. Check with your health care provider before you start taking any new prescriptions, over-the-counter medicines, herbs, or supplements. Do not combine any medicines that can cause this condition. Lifestyle  Maintain a healthy lifestyle. Eat a healthy diet that includes plenty of vegetables, fruits, whole grains, low-fat dairy products, and lean protein. Do not eat a lot of foods that are high in fat, added sugars, or salt. Get the right amount and quality of sleep. Most adults need 7-9 hours of sleep each night. Make time to exercise, even if it is only for short periods of time. Most adults should exercise for at least 150 minutes each week. Do not drink alcohol . Do not use illegal drugs. Do not take medicines for reasons other than they are prescribed. General instructions Do not use any products that contain  nicotine or tobacco. These products include cigarettes, chewing tobacco, and vaping devices, such as e-cigarettes. If you need help quitting, ask your health care provider. Contact a  health care provider if: Your symptoms do not improve or they get worse. Get help right away if: You have worsening confusion, severe headache, chest pain, high fever, seizures, or loss of consciousness. You experience serious side effects of medicine, such as swelling of your face, lips, tongue, or throat. These symptoms may be an emergency. Get help right away. Call 911. Do not wait to see if the symptoms will go away. Do not drive yourself to the hospital. Also, get help right away if: You have serious thoughts about hurting yourself or others. Take one of these steps if you feel like you may hurt yourself or others, or have thoughts about taking your own life: Go to your nearest emergency room. Call 911. Call the National Suicide Prevention Lifeline at 616-873-2580 or 988. This is open 24 hours a day. Text the Crisis Text Line at 903-753-7421. Summary Serotonin is a chemical that helps to control several functions in the body. High levels of serotonin in the body can cause serotonin syndrome, which can be life-threatening. This condition may be caused by taking medicines or drugs that increase the level of serotonin in your body. Treatment depends on the severity of your symptoms. For mild cases, stopping the medicine or drug that caused your condition is usually all that is needed. Check with your health care provider before you start taking any new prescriptions, over-the-counter medicines, herbs, or supplements. This information is not intended to replace advice given to you by your health care provider. Make sure you discuss any questions you have with your health care provider. Document Revised: 05/24/2021 Document Reviewed: 05/24/2021 Elsevier Patient Education  2024 [employer].
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
## 2023-11-06 ENCOUNTER — Encounter: Payer: Self-pay | Admitting: Psychiatry

## 2023-11-06 DIAGNOSIS — F1111 Opioid abuse, in remission: Secondary | ICD-10-CM | POA: Insufficient documentation

## 2023-11-09 ENCOUNTER — Other Ambulatory Visit: Payer: Self-pay

## 2023-11-09 ENCOUNTER — Emergency Department

## 2023-11-09 ENCOUNTER — Emergency Department
Admission: EM | Admit: 2023-11-09 | Discharge: 2023-11-09 | Disposition: A | Discharge status: Home / Self Care | Attending: Emergency Medicine | Admitting: Emergency Medicine

## 2023-11-09 DIAGNOSIS — R030 Elevated blood-pressure reading, without diagnosis of hypertension: Secondary | ICD-10-CM | POA: Diagnosis not present

## 2023-11-09 DIAGNOSIS — R0789 Other chest pain: Secondary | ICD-10-CM | POA: Diagnosis present

## 2023-11-09 LAB — CBC
HCT: 42.3 % (ref 39.0–52.0)
Hemoglobin: 14.4 g/dL (ref 13.0–17.0)
MCH: 29.9 pg (ref 26.0–34.0)
MCHC: 34 g/dL (ref 30.0–36.0)
MCV: 87.8 fL (ref 80.0–100.0)
Platelets: 238 K/uL (ref 150–400)
RBC: 4.82 MIL/uL (ref 4.22–5.81)
RDW: 13.8 % (ref 11.5–15.5)
WBC: 6.7 K/uL (ref 4.0–10.5)
nRBC: 0 % (ref 0.0–0.2)

## 2023-11-09 LAB — BASIC METABOLIC PANEL WITH GFR
Anion gap: 9 (ref 5–15)
BUN: 18 mg/dL (ref 6–20)
CO2: 26 mmol/L (ref 22–32)
Calcium: 9.6 mg/dL (ref 8.9–10.3)
Chloride: 98 mmol/L (ref 98–111)
Creatinine, Ser: 0.97 mg/dL (ref 0.61–1.24)
GFR, Estimated: >60 mL/min (ref >60)
Glucose, Bld: 94 mg/dL (ref 70–99)
Potassium: 3.9 mmol/L (ref 3.5–5.1)
Sodium: 133 mmol/L — ABNORMAL LOW (ref 135–145)

## 2023-11-09 LAB — TROPONIN I (HIGH SENSITIVITY): Troponin I (High Sensitivity): 2 ng/L (ref <18)

## 2023-11-09 NOTE — ED Provider Notes (Signed)
 [facility] Provider Note    Event Date/Time   First MD Initiated Contact with Patient 11/09/23 1206     (approximate)   History   Chest Pain   HPI  [name]. is a 58 y.o. male who presents with complaints of chest discomfort and high blood pressure.  Patient reports this morning he had mild pressure in his chest, he checked his blood pressure and found to be high nearly 200 systolic.  He reports over the last month or 2 he has had multiple episodes of chest pressure, occasionally and it is exertional, he has attributed to high blood pressure and or stress.  He has cardiology follow-up coming this week currently feels well and has no complaint     Physical Exam   Triage Vital Signs: ED Triage Vitals [11/09/23 1050]  Encounter Vitals Group     BP (!) 151/111     Girls Systolic BP Percentile      Girls Diastolic BP Percentile      Boys Systolic BP Percentile      Boys Diastolic BP Percentile      Pulse Rate 95     Resp 18     Temp 98.3 F (36.8 C)     Temp src      SpO2 99 %     Weight 71.7 kg (158 lb)     Height 1.6 m (5' 3)     Head Circumference      Peak Flow      Pain Score 5     Pain Loc      Pain Education      Exclude from Growth Chart     Most recent vital signs: Vitals:   11/09/23 1050 11/09/23 1242  BP: (!) 151/111 (!) 150/100  Pulse: 95 92  Resp: 18 17  Temp: 98.3 F (36.8 C) 98.1 F (36.7 C)  SpO2: 99% 98%     General: Awake, no distress.  CV:  Good peripheral perfusion.  Regular rate and rhythm Resp:  Normal effort.  CTA bilaterally Abd:  No distention.  Other:  No calf pain or swelling   ED Results / Procedures / Treatments   Labs (all labs ordered are listed, but only abnormal results are displayed) Labs Reviewed  BASIC METABOLIC PANEL WITH GFR - Abnormal; Notable for the following components:      Result Value   Sodium 133 (*)    All other components within normal limits  CBC  TROPONIN I  (HIGH SENSITIVITY)     EKG  ED ECG REPORT I, Lamar Price, the attending physician, personally viewed and interpreted this ECG.  Date: 11/09/2023  Rhythm: normal sinus rhythm QRS Axis: normal Intervals: normal ST/T Wave abnormalities: normal Narrative Interpretation: no evidence of acute ischemia    RADIOLOGY Chest x-ray viewed interpreted by me without acute abnormality    PROCEDURES:  Critical Care performed:   Procedures   MEDICATIONS ORDERED IN ED: Medications - No data to display   IMPRESSION / MDM / ASSESSMENT AND PLAN / ED COURSE  I reviewed the triage vital signs and the nursing notes. Patient's presentation is most consistent with acute presentation with potential threat to life or bodily function.  Patient presents with chest pressure as detailed above, differential includes ACS, angina, stress, high blood pressure, esophagitis/GERD  Overall quite well-appearing and in no acute distress, blood pressure has improved without intervention.  EKG is quite normal, high sensitive troponin is normal.  Chest x-ray without evidence of pneumothorax or pneumonia  Had long discussion with he and his wife, he has outpatient cardiology follow-up coming up shortly and good outpatient follow-up, no indication for admission at this time given resolution of symptoms, normal EKG, normal high sensitive troponin.  Not consistent with aortic syndrome  Appropriate for discharge at this time with strict return precautions, he agrees to this plan        FINAL CLINICAL IMPRESSION(S) / ED DIAGNOSES   Final diagnoses:  Atypical chest pain     Rx / DC Orders   ED Discharge Orders     None        Note:  This document was prepared using Dragon voice recognition software and may include unintentional dictation errors.   Arlander Charleston, MD 11/09/23 [phone]

## 2023-11-09 NOTE — ED Triage Notes (Signed)
 Pt comes with left sided cp that radiates to his left arm. Pt states this started month ago but has gotten worse. Pt does  have cardiac hx. Pt not on thinners.   Pt states throbbing pain and some sob. Pt also concerned for his HTN.

## 2023-11-11 NOTE — Progress Notes (Unsigned)
 Cardiology Office Note   Date:  11/12/2023  ID:  [name]., DOB 17-Feb-1966, MRN [id number] PCP: Tony Saber, NP  [facility] Providers Cardiologist:  Caron Poser, MD     History of Present Illness [name]. is a 58 y.o. male PMH HTN, HLD, DM 2 who presents for further evaluation of intermittent chest pain.  Patient was seen in the ED on 11/09/2023 for chest pain.  He ruled out for ACS with normal ECG and negative troponin.  Notably, he was seen back in the ED on 10/02/23 with dehydration and AKI and had mildly elevated but flat troponins 24-28.  Patient reports he has been dealing with quite a bit of stress and anxiety lately.  He notes that he is frequently having chest pain and shortness of breath.  The symptoms are often occurring with exertion and resolving with rest.  Sometimes the symptoms do occur at rest.  He reports a family history of several first-degree relatives having MIs in their 33s.  He also notes that his blood pressures have been higher and difficult to control at home.  Last LDL 83 10/2023  Relevant CVD History -Zio monitor pending; on my review, just shows 3 brief salvos of pSVT (longest 5 beats). Triggers all correspond to sinus rhythm.  ROS: Pt denies any jaw pain, arm pain, syncope, presyncope, orthopnea, PND, or LE edema.  Studies Reviewed I have independently reviewed the patient's ECG, zio monitor, and recent bloodwork.  Physical Exam VS:  BP (!) 140/82 (BP Location: Right Arm, Patient Position: Sitting, Cuff Size: Normal)   Pulse 84   Ht 5' 3 (1.6 m)   Wt 164 lb 9.6 oz (74.7 kg)   SpO2 97%   BMI 29.16 kg/m        Wt Readings from Last 3 Encounters:  11/12/23 164 lb 9.6 oz (74.7 kg)  11/09/23 158 lb (71.7 kg)  10/16/23 160 lb 12.8 oz (72.9 kg)    GEN: No acute distress. NECK: No JVD; No carotid bruits. CARDIAC: RRR, no murmurs, rubs, gallops. RESPIRATORY:  Clear to auscultation. EXTREMITIES:  Warm and  well-perfused. No edema.  ASSESSMENT AND PLAN Possible cardiac chest pain Patient presents with possible cardiac chest pain in the setting of significant ASCVD risk factors.  He had mildly elevated troponin back in July in the setting of dehydration, which could be an indicator that he has some degree of CAD.  Given these findings, he warrants further investigation.  Plan: - Coronary CT angiogram; he has a documented contrast allergy, so we will proceed with our standard premedication protocol - Add ASA 81 mg daily to regimen - Further plans pending CT results - If we are unable to do the CT for some reason, then would do a stress PET  HLD LDL 83 04/2023.  Given his concurrent diabetes and symptoms, would recommend more aggressive management and an LDL goal less than 70.  Discussed stopping his fibrate in favor of statin therapy to which he was amenable.   Plan: -Stop fibrate, start crestor  10mg  every day -Recheck lipids next visit  HTN Elevated on last several medical encounters. He also notes they have been consistently elevated at home.  Plan: -Increase to losartan  100-hydrochlorothiazide 12.5 mg every day; consider upritration and adding norvasc  if we cannot get him 130/80 or better -If we get to that point, then we should do a secondary hypertension workup with sleep study, renin-aldosterone assay, and renal artery US   Dispo: RTC 3 months  Signed, Caron Poser, MD

## 2023-11-12 ENCOUNTER — Ambulatory Visit

## 2023-11-12 VITALS — BP 140/82 | HR 84 | Ht 63.0 in | Wt 164.6 lb

## 2023-11-12 DIAGNOSIS — R079 Chest pain, unspecified: Secondary | ICD-10-CM | POA: Diagnosis not present

## 2023-11-12 DIAGNOSIS — Z79899 Other long term (current) drug therapy: Secondary | ICD-10-CM

## 2023-11-12 DIAGNOSIS — R072 Precordial pain: Secondary | ICD-10-CM | POA: Diagnosis not present

## 2023-11-12 MED ORDER — PREDNISONE 50 MG PO TABS: ORAL_TABLET | ORAL | 0 refills | Status: DC

## 2023-11-12 MED ORDER — DIPHENHYDRAMINE HCL 50 MG PO TABS
50.0000 mg | ORAL_TABLET | Freq: Once | ORAL | 0 refills | Status: DC
Start: 2023-11-12 — End: 2023-12-08

## 2023-11-12 MED ORDER — ASPIRIN 81 MG PO TBEC: 81.0000 mg | DELAYED_RELEASE_TABLET | Freq: Every day | ORAL | Status: DC

## 2023-11-12 MED ORDER — LOSARTAN POTASSIUM-HCTZ 100-25 MG PO TABS: 1.0000 | ORAL_TABLET | Freq: Every day | ORAL | 3 refills | Status: DC

## 2023-11-12 MED ORDER — ROSUVASTATIN CALCIUM 10 MG PO TABS: 10.0000 mg | ORAL_TABLET | Freq: Every day | ORAL | 3 refills | Status: DC

## 2023-11-12 MED ORDER — LOSARTAN POTASSIUM-HCTZ 100-12.5 MG PO TABS: 1.0000 | ORAL_TABLET | Freq: Every day | ORAL | 3 refills | Status: AC

## 2023-11-12 MED ORDER — METOPROLOL TARTRATE 50 MG PO TABS
ORAL_TABLET | ORAL | 0 refills | Status: DC
Start: 2023-11-12 — End: 2023-12-03

## 2023-11-12 NOTE — Addendum Note (Signed)
 Addended by: HARL HERON DEL on: 11/12/2023 09:12 AM   Modules accepted: Orders

## 2023-11-12 NOTE — Patient Instructions (Addendum)
 Medication Instructions:     Your physician recommends the following medication changes.  STOP TAKING: Fenofibrate  (TRICOR ) 48 MG  START TAKING: Aspirin  81 mg by mouth once daily  Rosuvastatin  (CRESTOR ) 10 mg by mouth once daily (evening/bedtime)  INCREASE: Losartan -Hydrochlorothiazide  from 50-12.5mg  to 100-12,5 mg *If you need a refill on your cardiac medications before your next appointment, please call your pharmacy*  Lab Work: No labs ordered today.  BMP drawn at [facility] ED on 11/09/2023 If you have labs (blood work) drawn today and your tests are completely normal, you will receive your results only by: MyChart Message (if you have MyChart) OR A paper copy in the mail If you have any lab test that is abnormal or we need to change your treatment, we will call you to review the results.  Testing/Procedures:   Your cardiac CT will be scheduled at :  [facility] [address] [place], [place] [address] [phone]  Please arrive 15 mins early for check-in and test prep.  There is spacious parking (valet parking available) and easy access to the radiology department from the [facility] entrance. Please enter here and check-in with the desk attendant.   Please follow these instructions carefully (unless otherwise directed):  An IV will be required for this test and Nitroglycerin will be given.   On the Night Before the Test: Be sure to Drink plenty of water. Do not consume any caffeinated/decaffeinated beverages or chocolate 12 hours prior to your test. Do not take any antihistamines 12 hours prior to your test. Patient has contrast allergy: Patient will need a prescription for Prednisone  and very clear instructions (as follows): Prednisone  50 mg - take 13 hours prior to test Take another Prednisone  50 mg 7 hours prior to test Take another Prednisone  50 mg 1 hour prior to test Take Benadryl  50 mg 1 hour prior to test Patient must complete  all four doses of above prophylactic medications. Patient will need a ride after test due to Benadryl .  On the Day of the Test: Drink plenty of water until 1 hour prior to the test. Do not eat any food 1 hour prior to test. You may take your regular medications prior to the test.  Take metoprolol  (Lopressor ) 100 mg two hours prior to test. Please HOLD your Losartan -Hydrochlorothiazide 100-12.5 mg on the morning of the test.      After the Test: Drink plenty of water. After receiving IV contrast, you may experience a mild flushed feeling. This is normal. On occasion, you may experience a mild rash up to 24 hours after the test. This is not dangerous. If this occurs, you can take Benadryl  25 mg, Zyrtec, Claritin, or Allegra and increase your fluid intake. (Patients taking Tikosyn should avoid Benadryl , and may take Zyrtec, Claritin, or Allegra) If you experience trouble breathing, this can be serious. If it is severe call 911 IMMEDIATELY. If it is mild, please call our office.  We will call to schedule your test 2-4 weeks out understanding that some insurance companies will need an authorization prior to the service being performed.   For more information and frequently asked questions, please visit our website : [email or web address]  For non-scheduling related questions, please contact the cardiac imaging nurse navigator should you have any questions/concerns: Cardiac Imaging Nurse Navigators Direct Office Dial: [phone]   For scheduling needs, including cancellations and rescheduling, please call [place], [phone].    Follow-Up: At [facility], you and your health needs  are our priority.  As part of our continuing mission to provide you with exceptional heart care, our providers are all part of one team.  This team includes your primary Cardiologist (physician) and Advanced Practice Providers or APPs (Physician Assistants and Nurse Practitioners) who all  work together to provide you with the care you need, when you need it.  Your next appointment:   3 month(s)  Provider:   You may see Caron Poser, MD   We recommend signing up for the patient portal called MyChart.  Sign up information is provided on this After Visit Summary.  MyChart is used to connect with patients for Virtual Visits (Telemedicine).  Patients are able to view lab/test results, encounter notes, upcoming appointments, etc.  Non-urgent messages can be sent to your provider as well.   To learn more about what you can do with MyChart, go to [email or web address].

## 2023-11-18 DIAGNOSIS — R079 Chest pain, unspecified: Secondary | ICD-10-CM | POA: Diagnosis not present

## 2023-11-20 ENCOUNTER — Other Ambulatory Visit: Payer: Self-pay | Admitting: Nurse Practitioner

## 2023-11-23 NOTE — Telephone Encounter (Signed)
 Please call and check with pt is he taking ozempic  or not.

## 2023-11-24 NOTE — Telephone Encounter (Signed)
 Spoke with pt and he stated that he is still taking the 0.5 mg dose. Is it okay to refill?

## 2023-11-27 ENCOUNTER — Other Ambulatory Visit: Payer: Self-pay | Admitting: Psychiatry

## 2023-11-27 ENCOUNTER — Other Ambulatory Visit: Payer: Self-pay | Admitting: Nurse Practitioner

## 2023-11-27 DIAGNOSIS — G4700 Insomnia, unspecified: Secondary | ICD-10-CM

## 2023-12-03 ENCOUNTER — Other Ambulatory Visit: Payer: Self-pay | Admitting: *Deleted

## 2023-12-03 MED ORDER — METOPROLOL TARTRATE 50 MG PO TABS: ORAL_TABLET | ORAL | 0 refills | Status: DC

## 2023-12-03 NOTE — Progress Notes (Signed)
 Ordering error:   Pt is to take Metoprolol  100 mg for Coronary CT scan; Prescription that was sent was not updated and was for 50 mg.  An additional 50 mg tablet was prescribed to complete the 100 mg order.  Pt was called and made aware of the mistake and that another prescription for Metoprolol  50 mg was sent to [facility].  Pt thanked me for the phone call.

## 2023-12-05 ENCOUNTER — Telehealth (HOSPITAL_COMMUNITY): Payer: Self-pay | Admitting: *Deleted

## 2023-12-05 NOTE — Telephone Encounter (Signed)
 Attempted to call patient regarding upcoming cardiac CT appointment. Left message on voicemail with name and callback number Sid Seats RN Navigator Cardiac Imaging [facility] [phone] Office

## 2023-12-08 ENCOUNTER — Other Ambulatory Visit (HOSPITAL_COMMUNITY): Payer: Self-pay | Admitting: *Deleted

## 2023-12-08 ENCOUNTER — Ambulatory Visit
Admission: RE | Admit: 2023-12-08 | Discharge: 2023-12-08 | Disposition: A | Discharge status: Home / Self Care | Source: Ambulatory Visit

## 2023-12-08 DIAGNOSIS — R072 Precordial pain: Secondary | ICD-10-CM

## 2023-12-08 MED ORDER — DIPHENHYDRAMINE HCL 50 MG PO TABS: 50.0000 mg | ORAL_TABLET | Freq: Once | ORAL | 0 refills | Status: DC

## 2023-12-08 MED ORDER — PREDNISONE 50 MG PO TABS: ORAL_TABLET | ORAL | 0 refills | Status: DC

## 2023-12-08 MED ORDER — IVABRADINE HCL 7.5 MG PO TABS: 15.0000 mg | ORAL_TABLET | Freq: Once | ORAL | 0 refills | Status: AC

## 2023-12-08 MED ORDER — METOPROLOL TARTRATE 100 MG PO TABS: ORAL_TABLET | ORAL | 0 refills | Status: DC

## 2023-12-08 NOTE — Progress Notes (Signed)
 Patient arrived for Cardiac CT took premedications of 13 hour prep and Metoprolol  100mg . Heart rate 114-116. Spoke with Dr Budd Kindle who ordered test to be rescheduled with 13 hour prep, Metoprolol  100mg  and add Ivabradine  15mg . Patient verbalized understanding plan of care. Notified nurse navigators.

## 2023-12-17 ENCOUNTER — Encounter: Payer: Self-pay | Admitting: Psychiatry

## 2023-12-17 ENCOUNTER — Ambulatory Visit: Admitting: Psychiatry

## 2023-12-17 ENCOUNTER — Other Ambulatory Visit: Payer: Self-pay

## 2023-12-17 VITALS — BP 152/96 | HR 101 | Temp 97.8°F | Ht 63.0 in | Wt 170.6 lb

## 2023-12-17 DIAGNOSIS — F1111 Opioid abuse, in remission: Secondary | ICD-10-CM | POA: Diagnosis not present

## 2023-12-17 DIAGNOSIS — F411 Generalized anxiety disorder: Secondary | ICD-10-CM | POA: Diagnosis not present

## 2023-12-17 DIAGNOSIS — G4700 Insomnia, unspecified: Secondary | ICD-10-CM | POA: Diagnosis not present

## 2023-12-17 MED ORDER — DIAZEPAM 10 MG PO TABS: 10.0000 mg | ORAL_TABLET | ORAL | 0 refills | Status: DC

## 2023-12-17 NOTE — Progress Notes (Unsigned)
 BH MD OP Progress Note  12/17/2023 8:54 AM [name].  MRN:  [id number]  Chief Complaint:  Chief Complaint  Patient presents with   Follow-up   Anxiety   Depression   Medication Refill   Discussed the use of AI scribe software for clinical note transcription with the patient, who gave verbal consent to proceed.  History of Present Illness [name]. is a 58 year old here for follow-up of anxiety and sleep concerns.  He reports that his generalized anxiety remains mostly controlled, though he continues to experience episodes of feeling overwhelmed, particularly in response to ongoing stressors involving his sons' substance use and related family dynamics. He reports feeling helpless when addressing his sons' substance dependence and notes that these situations can become overwhelming. He reports that anxiety significantly impacts his motivation and willingness to engage in activities outside the home, and he sometimes avoids going out due to worry about potential problems.  He reports that his sleep has improved, and he is able to sleep through the night. He takes trazodone  50 mg nightly for sleep and reports that it helps him fall asleep without causing morning drowsiness. He also takes Lexapro  and Buspar , and he reports that his Buspar  was recently readjusted to twice daily a few days before this visit. He describes his current regimen as effective overall, with only occasional episodes of feeling overwhelmed, occurring 1 or 2 times per week at most.  He reports that increased appetite and overeating have led to a weight gain of approximately 10 pounds since his symptoms began. He reports low energy and a tendency to stay at home, which he attributes to anxiety, and acknowledges that he sometimes has to push himself to be more active and engaged at work.  He denies thoughts of hurting himself or others.  Psychiatric History: He describes a past traumatic experience  related to a severe allergic reaction to CT contrast dye, which resulted in significant physical illness and prolonged recovery, and he identifies this as very traumatic. He denies having started therapy in the past and states that he has not yet engaged in therapy.  Substance History: He denies current use of drugs or alcohol .  Social History: He is currently employed and manages a business. He lives with his spouse, has children and a granddaughter, and remains active in church. He plans to attend a seafood festival and go fishing. He previously worked at [employer] and [employer] for 3 years.  Medical History: He has hypertension, a history of brain surgery, double hernia following contrast dye allergy, and allergy to contrast dye.   Visit Diagnosis:    ICD-10-CM   1. GAD (generalized anxiety disorder)  F41.1 diazepam  (VALIUM ) 10 MG tablet    2. Insomnia, unspecified type  G47.00       Past Psychiatric History: ***  Past Medical History:  Past Medical History:  Diagnosis Date   Anxiety    Brain tumor (benign) (HCC)    Hypertension     Past Surgical History:  Procedure Laterality Date   BRAIN SURGERY     HIP SURGERY     NECK SURGERY     VENTRICULOPERITONEAL SHUNT      Family Psychiatric History: ***  Family History:  Family History  Problem Relation Age of Onset   Heart attack Father    Heart disease Father    Kidney cancer Father    Stroke Paternal Uncle    Heart failure Paternal Uncle  Early death Paternal Uncle    Heart attack Maternal Grandfather    Hyperlipidemia Paternal Grandmother    Hypertension Paternal Grandmother    Heart attack Paternal Grandfather    Stroke Paternal Grandfather    Heart failure Paternal Grandfather    Drug abuse Son     Social History:  Social History   Socioeconomic History   Marital status: Married    Spouse name: amanda   Number of children: 1   Years of education: Not on file   Highest education level:  High school graduate  Occupational History   Occupation: self  Tobacco Use   Smoking status: Former    Current packs/day: 0.00    Types: Cigarettes    Quit date: 2014    Years since quitting: 11.7   Smokeless tobacco: Never  Vaping Use   Vaping status: Never Used  Substance and Sexual Activity   Alcohol  use: Not Currently   Drug use: Not Currently   Sexual activity: Yes  Other Topics Concern   Not on file  Social History Narrative   Not on file   Social Drivers of Health   Financial Resource Strain: Low Risk  (10/02/2023)   Received from [facility]   Overall Financial Resource Strain (CARDIA)    Difficulty of Paying Living Expenses: Not hard at all  Food Insecurity: No Food Insecurity (10/02/2023)   Received from [facility]   Hunger Vital Sign    Within the past 12 months, you worried that your food would run out before you got the money to buy more.: Never true    Within the past 12 months, the food you bought just didn't last and you didn't have money to get more.: Never true  Transportation Needs: No Transportation Needs (10/02/2023)   Received from [facility] - Transportation    In the past 12 months, has lack of transportation kept you from medical appointments or from getting medications?: No    Lack of Transportation (Non-Medical): No  Physical Activity: Not on file  Stress: Not on file  Social Connections: Not on file    Allergies:  Allergies  Allergen Reactions   Ivp Dye [Iodinated Contrast Media]    Penicillins     Metabolic Disorder Labs: Lab Results  Component Value Date   HGBA1C 6.1 04/22/2023   MPG 128 03/01/2022   No results found for: PROLACTIN Lab Results  Component Value Date   CHOL 159 10/29/2023   TRIG 166.0 (H) 10/29/2023   HDL 42.50 10/29/2023   CHOLHDL 4 10/29/2023   VLDL 33.2 10/29/2023   LDLCALC 83 10/29/2023   LDLCALC 144 (H) 03/01/2022   Lab Results   Component Value Date   TSH 0.71 10/29/2023   TSH 1.12 04/22/2023    Therapeutic Level Labs: No results found for: LITHIUM No results found for: VALPROATE No results found for: CBMZ  Current Medications: Current Outpatient Medications  Medication Sig Dispense Refill   diazepam  (VALIUM ) 10 MG tablet Take 1 tablet (10 mg total) by mouth as directed for 1 dose. Take 1 tablet 20 minutes before your procedure ( consult with cardiology prior to taking ) 1 tablet 0   ivabradine  (CORLANOR) 7.5 MG TABS tablet Take 15 mg by mouth once.     albuterol  (VENTOLIN  HFA) 108 (90 Base) MCG/ACT inhaler TAKE 2 PUFFS BY MOUTH EVERY 6 HOURS AS NEEDED FOR WHEEZE OR SHORTNESS OF BREATH 8.5 each 1   busPIRone  (  BUSPAR ) 10 MG tablet TAKE 1 TABLET BY MOUTH TWICE A DAY 180 tablet 1   diphenhydrAMINE  (BENADRYL ) 50 MG tablet Take 1 tablet (50 mg total) by mouth once for 1 dose. 1 hour prior to test 1 tablet 0   escitalopram  (LEXAPRO ) 10 MG tablet Take 1 tablet (10 mg total) by mouth daily. 90 tablet 1   losartan -hydrochlorothiazide (HYZAAR) 100-12.5 MG tablet Take 1 tablet by mouth daily. 90 tablet 3   metoprolol  succinate (TOPROL -XL) 25 MG 24 hr tablet TAKE 1 TABLET BY MOUTH EVERY DAY 30 tablet 5   metoprolol  tartrate (LOPRESSOR ) 100 MG tablet Take 1 tablet (100mg ) TWO hours prior to CT scan 1 tablet 0   metoprolol  tartrate (LOPRESSOR ) 50 MG tablet TAKE 1 TABLET 2 HR PRIOR TO CARDIAC PROCEDURE 1 tablet 0   predniSONE  (DELTASONE ) 50 MG tablet Take 1 tablet by mouth 13 hrs prior to test, another tablet 7 hours prior, and last dose 1 hour prior. 3 tablet 0   rosuvastatin  (CRESTOR ) 10 MG tablet Take 1 tablet (10 mg total) by mouth daily. 90 tablet 3   Semaglutide ,0.25 or 0.5MG /DOS, (OZEMPIC , 0.25 OR 0.5 MG/DOSE,) 2 MG/3ML SOPN INJECT 0.5 MG INTO THE SKIN ONE TIME PER WEEK 3 mL 3   traZODone  (DESYREL ) 50 MG tablet Take 0.5-1 tablets (25-50 mg total) by mouth at bedtime as needed for sleep. 30 tablet 1   No current  facility-administered medications for this visit.     Musculoskeletal: Strength & Muscle Tone: within normal limits Gait & Station: normal Patient leans: N/A  Psychiatric Specialty Exam: Review of Systems  Psychiatric/Behavioral:  The patient is nervous/anxious.     Blood pressure (!) 152/96, pulse (!) 101, temperature 97.8 F (36.6 C), temperature source Temporal, height 5' 3 (1.6 m), weight 170 lb 9.6 oz (77.4 kg).Body mass index is 30.22 kg/m.  General Appearance: Fairly Groomed  Eye Contact:  Fair  Speech:  Clear and Coherent  Volume:  Normal  Mood:  Anxious  Affect:  Appropriate  Thought Process:  Goal Directed and Descriptions of Associations: Intact  Orientation:  Full (Time, Place, and Person)  Thought Content: Logical   Suicidal Thoughts:  No  Homicidal Thoughts:  No  Memory:  Immediate;   Fair Recent;   Fair Remote;   Fair  Judgement:  Fair  Insight:  Fair  Psychomotor Activity:  Normal  Concentration:  Concentration: Fair and Attention Span: Fair  Recall:  [employer] of Knowledge: Fair  Language: Fair  Akathisia:  No  Handed:  Left  AIMS (if indicated): not done  Assets:  Communication Skills Desire for Improvement Housing Social Support Talents/Skills  ADL's:  Intact  Cognition: WNL  Sleep:  Fair   Screenings: GAD-7    Garment/textile technologist Visit from 12/17/2023 in [facility] Office Visit from 11/05/2023 in [facility] Office Visit from 10/16/2023 in [facility] [employer] Visit from 04/22/2023 in [facility] [employer] Visit from 10/17/2022 in [facility] [employer]  Total GAD-7 Score 12 21 20 4 5    [employer]    Flowsheet Row Office Visit from 12/17/2023 in [facility] Office Visit from 11/05/2023 in [facility]  [facility] Associates Office Visit from 10/16/2023 in [facility] Visit from 04/22/2023 in [facility] [employer] Visit from 10/17/2022  in [facility] at [employer]  PHQ-2 Total Score 2 2 6  0 0  PHQ-9 Total Score 8 17 24 1 4    Flowsheet Row Office Visit from 12/17/2023 in [facility] ED from 11/09/2023 in [facility] Emergency Department at [facility] Visit from 11/05/2023 in [facility]  C-SSRS RISK CATEGORY No Risk No Risk No Risk     Assessment and Plan: ***  Collaboration of Care: Collaboration of Care: [facility] OP Collaboration of Rjmz:[id number]}  Patient/Guardian was advised Release of Information must be obtained prior to any record release in order to collaborate their care with an outside provider. Patient/Guardian was advised if they have not already done so to contact the registration department to sign all necessary forms in order for us  to release information regarding their care.   Consent: Patient/Guardian gives verbal consent for treatment and assignment of benefits for services provided during this visit. Patient/Guardian expressed understanding and agreed to proceed.    Terre Hanneman, MD 12/17/2023, 8:54 AM

## 2023-12-18 ENCOUNTER — Encounter: Payer: Self-pay | Admitting: Nurse Practitioner

## 2023-12-18 ENCOUNTER — Ambulatory Visit: Admitting: Nurse Practitioner

## 2023-12-18 VITALS — BP 144/84 | HR 100 | Temp 98.1°F | Ht 63.0 in | Wt 170.4 lb

## 2023-12-18 DIAGNOSIS — R7303 Prediabetes: Secondary | ICD-10-CM

## 2023-12-18 DIAGNOSIS — I10 Essential (primary) hypertension: Secondary | ICD-10-CM

## 2023-12-18 DIAGNOSIS — J019 Acute sinusitis, unspecified: Secondary | ICD-10-CM | POA: Diagnosis not present

## 2023-12-18 DIAGNOSIS — J0140 Acute pansinusitis, unspecified: Secondary | ICD-10-CM

## 2023-12-18 LAB — POCT GLYCOSYLATED HEMOGLOBIN (HGB A1C): Hemoglobin A1C: 5.2 % (ref 4.0–5.6)

## 2023-12-18 MED ORDER — DOXYCYCLINE HYCLATE 100 MG PO TABS: 100.0000 mg | ORAL_TABLET | Freq: Two times a day (BID) | ORAL | 0 refills | Status: AC

## 2023-12-18 NOTE — Progress Notes (Unsigned)
 Established Patient Office Visit  Subjective:  Patient ID: Tony Bastidas., male    DOB: 02-21-1966  Age: 58 y.o. MRN: [id number]  CC:  Chief Complaint  Patient presents with   Medical Management of Chronic Issues   Discussed the use of a AI scribe software for clinical note transcription with the patient, who gave verbal consent to proceed.  HPI  Tony Liburd. presents for Chronic disease follow up.   Anxiety: Anxiety episodes have improved, now occurring about once a week and are less severe. Stress, particularly involving his son, contributes to elevated blood pressure, which is higher on stressful days.  Hypertension: Home blood pressure readings in the morning range from 140-156 mmHg systolic and 90-100 mmHg diastolic. Pulse rates have reached up to 125 bpm, previously causing a CT scan cancellation. Blood pressure medication is taken at 4:30 AM, with readings checked at 6:30 AM. Relaxation techniques help reduce blood pressure.  Current medications include losartan , metoprolol , rosuvastatin , semaglutide , trazodone , Buspar  twice daily, Lexapro  once daily, and albuterol  as needed. Trazodone  improves sleep without causing morning drowsiness.  A sinus infection has persisted for about a month, with symptoms of stuffy sinuses, thick yellow mucus, and sinus headaches. He is allergic to penicillin and uses Flonase nasal spray daily. He suspects ragweed allergies may be related.  His work schedule involves significant travel and stress, contributing to anxiety and blood pressure issues. 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  HPI   Past Medical History:  Diagnosis Date   Anxiety    Brain tumor (benign) (HCC)    Hypertension     Past Surgical History:  Procedure Laterality Date   BRAIN SURGERY     HIP SURGERY     NECK SURGERY     VENTRICULOPERITONEAL SHUNT      Family History  Problem Relation Age  of Onset   Kidney cancer Father    Hypertension Father    Hyperlipidemia Father    Heart attack Father    Heart disease Father    Hyperlipidemia Paternal Grandmother    Hypertension Paternal Grandmother    Heart attack Paternal Grandfather    Stroke Paternal Grandfather    Heart failure Paternal Grandfather    Drug abuse Son    Stroke Paternal Uncle    Heart failure Paternal Uncle    Early death Paternal Uncle     Social History   Socioeconomic History   Marital status: Married    Spouse name: amanda   Number of children: 1   Years of education: Not on file   Highest education level: High school graduate  Occupational History   Occupation: self  Tobacco Use   Smoking status: Former    Current packs/day: 0.00    Types: Cigarettes    Quit date: 2014    Years since quitting: 11.7   Smokeless tobacco: Never  Vaping Use   Vaping status: Never Used  Substance and Sexual Activity   Alcohol  use: Not Currently   Drug use: Not Currently   Sexual activity: Yes  Other Topics Concern   Not on file  Social History Narrative   Not on file   Social Drivers of Health   Financial Resource Strain: Low Risk  (10/02/2023)   Received from [facility]   Overall Financial Resource Strain (CARDIA)    Difficulty of Paying Living Expenses: Not hard at all  Food Insecurity: No Food Insecurity (10/02/2023)  Received from [facility]   Hunger Vital Sign    Within the past 12 months, you worried that your food would run out before you got the money to buy more.: Never true    Within the past 12 months, the food you bought just didn't last and you didn't have money to get more.: Never true  Transportation Needs: No Transportation Needs (10/02/2023)   Received from [facility] - Transportation    In the past 12 months, has lack of transportation kept you from medical appointments or from getting medications?: No    Lack of  Transportation (Non-Medical): No  Physical Activity: Not on file  Stress: Not on file  Social Connections: Not on file  Intimate Partner Violence: Not on file     Outpatient Medications Prior to Visit  Medication Sig Dispense Refill   albuterol  (VENTOLIN  HFA) 108 (90 Base) MCG/ACT inhaler TAKE 2 PUFFS BY MOUTH EVERY 6 HOURS AS NEEDED FOR WHEEZE OR SHORTNESS OF BREATH 8.5 each 1   busPIRone  (BUSPAR ) 10 MG tablet TAKE 1 TABLET BY MOUTH TWICE A DAY 180 tablet 1   diazepam  (VALIUM ) 10 MG tablet Take 1 tablet (10 mg total) by mouth as directed for 1 dose. Take 1 tablet 20 minutes before your procedure ( consult with cardiology prior to taking ) 1 tablet 0   diphenhydrAMINE  (BENADRYL ) 50 MG tablet Take 1 tablet (50 mg total) by mouth once for 1 dose. 1 hour prior to test 1 tablet 0   escitalopram  (LEXAPRO ) 10 MG tablet Take 1 tablet (10 mg total) by mouth daily. 90 tablet 1   ivabradine  (CORLANOR) 7.5 MG TABS tablet Take 15 mg by mouth once.     losartan -hydrochlorothiazide (HYZAAR) 100-12.5 MG tablet Take 1 tablet by mouth daily. 90 tablet 3   metoprolol  tartrate (LOPRESSOR ) 100 MG tablet Take 1 tablet (100mg ) TWO hours prior to CT scan 1 tablet 0   metoprolol  tartrate (LOPRESSOR ) 50 MG tablet TAKE 1 TABLET 2 HR PRIOR TO CARDIAC PROCEDURE 1 tablet 0   predniSONE  (DELTASONE ) 50 MG tablet Take 1 tablet by mouth 13 hrs prior to test, another tablet 7 hours prior, and last dose 1 hour prior. 3 tablet 0   rosuvastatin  (CRESTOR ) 10 MG tablet Take 1 tablet (10 mg total) by mouth daily. 90 tablet 3   Semaglutide ,0.25 or 0.5MG /DOS, (OZEMPIC , 0.25 OR 0.5 MG/DOSE,) 2 MG/3ML SOPN INJECT 0.5 MG INTO THE SKIN ONE TIME PER WEEK 3 mL 3   metoprolol  succinate (TOPROL -XL) 25 MG 24 hr tablet TAKE 1 TABLET BY MOUTH EVERY DAY 30 tablet 5   traZODone  (DESYREL ) 50 MG tablet Take 0.5-1 tablets (25-50 mg total) by mouth at bedtime as needed for sleep. 30 tablet 1   No facility-administered medications prior to visit.     Allergies  Allergen Reactions   Ivp Dye [Iodinated Contrast Media]    Penicillins     ROS Review of Systems Negative unless indicated in HPI.    Objective:    Physical Exam Constitutional:      Appearance: Normal appearance.  HENT:     Mouth/Throat:     Mouth: Mucous membranes are moist.  Eyes:     Conjunctiva/sclera: Conjunctivae normal.     Pupils: Pupils are equal, round, and reactive to light.  Cardiovascular:     Rate and Rhythm: Normal rate and regular rhythm.     Pulses: Normal pulses.     Heart sounds: Normal  heart sounds.  Pulmonary:     Effort: Pulmonary effort is normal.     Breath sounds: Normal breath sounds.  Musculoskeletal:     Cervical back: Normal range of motion. No tenderness.  Skin:    General: Skin is warm.     Findings: No bruising.  Neurological:     General: No focal deficit present.     Mental Status: He is alert and oriented to person, place, and time. Mental status is at baseline.  Psychiatric:        Mood and Affect: Mood normal.        Behavior: Behavior normal.        Thought Content: Thought content normal.        Judgment: Judgment normal.     BP (!) 144/84   Pulse 100   Temp 98.1 F (36.7 C)   Ht 5' 3 (1.6 m)   Wt 170 lb 6.4 oz (77.3 kg)   SpO2 97%   BMI 30.19 kg/m  Wt Readings from Last 3 Encounters:  12/25/23 165 lb (74.8 kg)  12/18/23 170 lb 6.4 oz (77.3 kg)  11/12/23 164 lb 9.6 oz (74.7 kg)     Health Maintenance  Topic Date Due   OPHTHALMOLOGY EXAM  Never done   HIV Screening  Never done   Diabetic kidney evaluation - Urine ACR  Never done   Hepatitis C Screening  Never done   DTaP/Tdap/Td (1 - Tdap) Never done   Pneumococcal Vaccine: 50+ Years (1 of 2 - PCV) Never done   Hepatitis B Vaccines 19-59 Average Risk (1 of 3 - 19+ 3-dose series) Never done   Zoster Vaccines- Shingrix (1 of 2) Never done   Colonoscopy  03/01/2023   COVID-19 Vaccine (1) 01/02/2024 (Originally 11/03/1970)   Influenza Vaccine   06/15/2024 (Originally 10/17/2023)   FOOT EXAM  04/21/2024   HEMOGLOBIN A1C  06/17/2024   Diabetic kidney evaluation - eGFR measurement  12/24/2024   HPV VACCINES  Aged Out   Meningococcal B Vaccine  Aged Out       Topic Date Due   Hepatitis B Vaccines 19-59 Average Risk (1 of 3 - 19+ 3-dose series) Never done    Lab Results  Component Value Date   TSH 0.71 10/29/2023   Lab Results  Component Value Date   WBC 7.4 12/25/2023   HGB 14.0 12/25/2023   HCT 42.1 12/25/2023   MCV 89.8 12/25/2023   PLT 224 12/25/2023   Lab Results  Component Value Date   NA 139 12/25/2023   K 4.2 12/25/2023   CO2 23 12/25/2023   GLUCOSE 83 12/25/2023   BUN 17 12/25/2023   CREATININE 0.91 12/25/2023   BILITOT 0.5 12/25/2023   ALKPHOS 58 12/25/2023   AST 19 12/25/2023   ALT 32 12/25/2023   PROT 7.3 12/25/2023   ALBUMIN 4.3 12/25/2023   CALCIUM  9.2 12/25/2023   ANIONGAP 12 12/25/2023   EGFR 94 03/01/2022   GFR 66.76 04/22/2023   Lab Results  Component Value Date   CHOL 159 10/29/2023   Lab Results  Component Value Date   HDL 42.50 10/29/2023   Lab Results  Component Value Date   LDLCALC 83 10/29/2023   Lab Results  Component Value Date   TRIG 166.0 (H) 10/29/2023   Lab Results  Component Value Date   CHOLHDL 4 10/29/2023   Lab Results  Component Value Date   HGBA1C 5.2 12/18/2023      Assessment & Plan:  Prediabetes Assessment & Plan: Lab Results  Component Value Date   HGBA1C 5.2 12/18/2023     Orders: -     POCT glycosylated hemoglobin (Hb A1C)  Subacute sinusitis, unspecified location Assessment & Plan: Persistent nasal congestion, thick yellow discharge, sinus headache. Allergies suspected. - Prescribe doxycycline.    Hypertension, unspecified type Assessment & Plan: Patient BP  Vitals:   12/18/23 0823 12/18/23 0838  BP: (!) 144/86 (!) 144/84    in the office  -Advised pt to follow a low sodium and heart healthy diet. - Advised patient to check  blood pressure at home monitoring readings at next visit.     Other orders -     Doxycycline Hyclate; Take 1 tablet (100 mg total) by mouth 2 (two) times daily for 7 days.  Dispense: 14 tablet; Refill: 0    Follow-up: No follow-ups on file.   Adeja Sarratt, NP

## 2023-12-19 ENCOUNTER — Encounter: Payer: Self-pay | Admitting: Nurse Practitioner

## 2023-12-23 ENCOUNTER — Other Ambulatory Visit: Payer: Self-pay | Admitting: Nurse Practitioner

## 2023-12-24 ENCOUNTER — Ambulatory Visit: Payer: Self-pay | Admitting: Psychiatry

## 2023-12-25 ENCOUNTER — Other Ambulatory Visit: Payer: Self-pay

## 2023-12-25 ENCOUNTER — Emergency Department: Admission: EM | Admit: 2023-12-25 | Discharge: 2023-12-25 | Disposition: A | Discharge status: Home / Self Care

## 2023-12-25 ENCOUNTER — Encounter: Payer: Self-pay | Admitting: Nurse Practitioner

## 2023-12-25 ENCOUNTER — Emergency Department

## 2023-12-25 ENCOUNTER — Other Ambulatory Visit: Payer: Self-pay | Admitting: Nurse Practitioner

## 2023-12-25 DIAGNOSIS — R911 Solitary pulmonary nodule: Secondary | ICD-10-CM | POA: Insufficient documentation

## 2023-12-25 DIAGNOSIS — R1013 Epigastric pain: Secondary | ICD-10-CM

## 2023-12-25 DIAGNOSIS — K625 Hemorrhage of anus and rectum: Secondary | ICD-10-CM | POA: Diagnosis present

## 2023-12-25 DIAGNOSIS — I10 Essential (primary) hypertension: Secondary | ICD-10-CM | POA: Insufficient documentation

## 2023-12-25 LAB — URINALYSIS, ROUTINE W REFLEX MICROSCOPIC
Bilirubin Urine: NEGATIVE
Glucose, UA: NEGATIVE mg/dL
Hgb urine dipstick: NEGATIVE
Ketones, ur: NEGATIVE mg/dL
Leukocytes,Ua: NEGATIVE
Nitrite: NEGATIVE
Protein, ur: NEGATIVE mg/dL
Specific Gravity, Urine: 1.02 (ref 1.005–1.030)
pH: 5 (ref 5.0–8.0)

## 2023-12-25 LAB — COMPREHENSIVE METABOLIC PANEL WITH GFR
ALT: 32 U/L (ref 0–44)
AST: 19 U/L (ref 15–41)
Albumin: 4.3 g/dL (ref 3.5–5.0)
Alkaline Phosphatase: 58 U/L (ref 38–126)
Anion gap: 12 (ref 5–15)
BUN: 17 mg/dL (ref 6–20)
CO2: 23 mmol/L (ref 22–32)
Calcium: 9.2 mg/dL (ref 8.9–10.3)
Chloride: 104 mmol/L (ref 98–111)
Creatinine, Ser: 0.91 mg/dL (ref 0.61–1.24)
GFR, Estimated: >60 mL/min (ref >60)
Glucose, Bld: 83 mg/dL (ref 70–99)
Potassium: 4.2 mmol/L (ref 3.5–5.1)
Sodium: 139 mmol/L (ref 135–145)
Total Bilirubin: 0.5 mg/dL (ref 0.0–1.2)
Total Protein: 7.3 g/dL (ref 6.5–8.1)

## 2023-12-25 LAB — CBC
HCT: 42.1 % (ref 39.0–52.0)
Hemoglobin: 14 g/dL (ref 13.0–17.0)
MCH: 29.9 pg (ref 26.0–34.0)
MCHC: 33.3 g/dL (ref 30.0–36.0)
MCV: 89.8 fL (ref 80.0–100.0)
Platelets: 224 K/uL (ref 150–400)
RBC: 4.69 MIL/uL (ref 4.22–5.81)
RDW: 14.3 % (ref 11.5–15.5)
WBC: 7.4 K/uL (ref 4.0–10.5)
nRBC: 0 % (ref 0.0–0.2)

## 2023-12-25 LAB — LIPASE, BLOOD: Lipase: 153 U/L — ABNORMAL HIGH (ref 11–51)

## 2023-12-25 MED ORDER — ACETAMINOPHEN 500 MG PO TABS
1000.0000 mg | ORAL_TABLET | Freq: Once | ORAL | Status: AC
  Administered 2023-12-25: 1000 mg via ORAL
  Filled 2023-12-25: qty 2

## 2023-12-25 MED ORDER — ALUM & MAG HYDROXIDE-SIMETH 200-200-20 MG/5ML PO SUSP
30.0000 mL | Freq: Once | ORAL | Status: AC
Start: 2023-12-25 — End: 2023-12-25
  Administered 2023-12-25: 30 mL via ORAL
  Filled 2023-12-25: qty 30

## 2023-12-25 MED ORDER — FAMOTIDINE 20 MG PO TABS
20.0000 mg | ORAL_TABLET | Freq: Once | ORAL | Status: AC
  Administered 2023-12-25: 20 mg via ORAL
  Filled 2023-12-25: qty 1

## 2023-12-25 MED ORDER — DIAZEPAM 5 MG PO TABS
5.0000 mg | ORAL_TABLET | Freq: Once | ORAL | 0 refills | Status: DC | PRN
Start: 2023-12-25 — End: 2024-01-12

## 2023-12-25 MED ORDER — LIDOCAINE VISCOUS HCL 2 % MT SOLN
15.0000 mL | Freq: Once | OROMUCOSAL | Status: AC
  Administered 2023-12-25: 15 mL via ORAL
  Filled 2023-12-25: qty 15

## 2023-12-25 NOTE — Discharge Instructions (Signed)
 Your evaluation in the emergency department was overall reassuring.  I am unsure as to the exact cause of your rectal bleeding, though it is possible you may have internal hemorrhoids.  Fortunately your blood level today is stable and her upper abdominal symptoms improved with treatment with antacids.  I recommend doubling your omeprazole to twice daily, and you can also take Maalox in addition.  You can use Tylenol  for any discomfort but I recommend avoiding any NSAIDs (ibuprofen, Motrin, Advil) as this could worsen your symptoms.  As discussed, please follow-up with your gastroenterologist for reevaluation of your recent rectal bleeding as well as your primary care provider.  Return to the emergency department with any new or worsening symptoms.  As mentioned, we did incidentally find a small pulmonary nodule on the CT scan today, and I have placed a referral for you to follow-up with a pulmonologist for monitoring of this.

## 2023-12-25 NOTE — Progress Notes (Signed)
Controlled substance database reviewed.  Medication sent to pharmacy.

## 2023-12-25 NOTE — ED Triage Notes (Signed)
 Pt comes with rectal bleeding that started two weeks ago and was spotty. Pt stats since Monday it got really bad. Pt states hx of stomach ulcers. Pt states he was hospitalized in past for it. Pt states no N/V.

## 2023-12-25 NOTE — ED Notes (Signed)
 Patient transported to CT scan by CT staff.

## 2023-12-25 NOTE — ED Provider Notes (Signed)
 [facility] Provider Note    Event Date/Time   First MD Initiated Contact with Patient 12/25/23 1149     (approximate)   History   Abdominal Pain  Pt comes with rectal bleeding that started two weeks ago and was spotty. Pt stats since Monday it got really bad. Pt states hx of stomach ulcers. Pt states he was hospitalized in past for it. Pt states no N/V.   HPI [name]. is a 58 y.o. male PMH hypertension, anxiety, prior brain tumor with ventriculoperitoneal shunt presents for evaluation of rectal bleeding -Patient has been noticing rectal bleeding over the past 4 days, has been having some blood pooling in his underwear throughout the day outside of bowel movements.  Bright red with some clots.  No melena. - Does have some epigastric burning sensation.  Denies chest pain or shortness of breath.  No lightheadedness. - Not on blood thinners - Chest pain or shortness of breath         Physical Exam   Triage Vital Signs: ED Triage Vitals  Encounter Vitals Group     BP 12/25/23 1058 (!) 148/106     Girls Systolic BP Percentile --      Girls Diastolic BP Percentile --      Boys Systolic BP Percentile --      Boys Diastolic BP Percentile --      Pulse Rate 12/25/23 1058 87     Resp 12/25/23 1058 18     Temp 12/25/23 1058 98 F (36.7 C)     Temp src --      SpO2 12/25/23 1058 98 %     Weight 12/25/23 1056 165 lb (74.8 kg)     Height 12/25/23 1056 5' 3 (1.6 m)     Head Circumference --      Peak Flow --      Pain Score 12/25/23 1056 5     Pain Loc --      Pain Education --      Exclude from Growth Chart --     Most recent vital signs: Vitals:   12/25/23 1058  BP: (!) 148/106  Pulse: 87  Resp: 18  Temp: 98 F (36.7 C)  SpO2: 98%     General: Awake, no distress.  CV:  Good peripheral perfusion. RRR, RP 2+ Resp:  Normal effort. CTAB Abd:  No distention.  Moderate tenderness to palpation in epigastrium, mild tenderness in  left lower quadrant only. Rectal:  DRE with trace blood on glove, no masses, no fissures appreciated, no obvious external hemorrhoids, no significant pain on exam   ED Results / Procedures / Treatments   Labs (all labs ordered are listed, but only abnormal results are displayed) Labs Reviewed  URINALYSIS, ROUTINE W REFLEX MICROSCOPIC - Abnormal; Notable for the following components:      Result Value   Color, Urine YELLOW (*)    APPearance CLEAR (*)    All other components within normal limits  COMPREHENSIVE METABOLIC PANEL WITH GFR  CBC  LIPASE, BLOOD     EKG  Ecg = sinus rhythm, rate 83, no gross ST elevation or depression, no significant repolarization normality, left axis deviation, normal intervals.  No clear evidence of ischemia nor arrhythmia my interpretation.   RADIOLOGY CT abdomen pelvis interpreted by myself and radiology report reviewed.  No acute pathology identified.  Incidental pulmonary nodule.    PROCEDURES:  Critical Care performed: No  Procedures   MEDICATIONS  ORDERED IN ED: Medications  alum & mag hydroxide-simeth (MAALOX/MYLANTA) 200-200-20 MG/5ML suspension 30 mL (30 mLs Oral Given 12/25/23 1243)    And  lidocaine (XYLOCAINE) 2 % viscous mouth solution 15 mL (15 mLs Oral Given 12/25/23 1243)  famotidine (PEPCID) tablet 20 mg (20 mg Oral Given 12/25/23 1241)  acetaminophen  (TYLENOL ) tablet 1,000 mg (1,000 mg Oral Given 12/25/23 1241)     IMPRESSION / MDM / ASSESSMENT AND PLAN / ED COURSE  I reviewed the triage vital signs and the nursing notes.                              DDX/MDM/AP: Differential diagnosis includes, but is not limited to, dyspepsia/PUD/GERD, cholecystitis, pancreatitis, doubt anginal equivalent given focal pain on exam.  Also consider diverticulitis.  Regarding rectal bleeding, consider internal hemorrhoids, diverticulitis, bleeding polyps as etiology.  Plan: - Labs - GI cocktail, Tylenol  - CT abdomen pelvis -   reassess  Patient's presentation is most consistent with acute presentation with potential threat to life or bodily function.  The patient is on the cardiac monitor to evaluate for evidence of arrhythmia and/or significant heart rate changes.  ED course below.  Workup reassuring--no anemia, BUN normal, overall not suggestive of upper GI bleed.  Patient feeling much better after GI cocktail here.  No evidence of diverticulitis on CT Noncon (contrast allergy).  He is already established with a gastroenterologist, counseled to follow-up with them in the outpatient setting for reevaluation of his mild rectal bleeding.  He is already on antacid, recommend starting at twice daily, avoid NSAIDs, patient will also buy OTC Maalox.  Suspect dyspepsia as likely etiology of upper abdominal discomfort.  ED return precautions in place.  Patient agrees with plan.  Incidentally found to have pulmonary nodule.  No related symptoms.  Patient notified.  Pulmonary nodule referral placed.  Clinical Course as of 12/25/23 1521  Thu Dec 25, 2023  1201 CBC reviewed, hemoglobin at baseline  CMP reviewed, unremarkable, BUN normal [MM]  1201 Urinalysis with no evidence of infection [MM]  1416 CTAP: IMPRESSION: 1. 4 mm subpleural nodule seen in right middle lobe. If patient is low risk for malignancy, no routine follow-up imaging is recommended. If patient is high risk for malignancy, a non-contrast chest CT at 12 months is optional.This recommendation follows the consensus statement: Guidelines for Management of Incidental Pulmonary Nodules Detected on CT Images: From the Fleischner Society 2017; Radiology 2017; 284:228-243. 2. No acute abnormality seen in the abdomen or pelvis. 3. Aortic atherosclerosis.   [MM]  1505 Patient reevaluated, feeling much better, notes burning sensation in his epigastrium is almost entirely resolved.  Reassured by unremarkable workup including CT abdomen pelvis.  Did discuss incidental  pulmonary nodule, will follow-up outpatient and I will place pulmonary nodule clinic referral.  Patient is already established with a gastroenterologist, notes he was supposed to have colonoscopy done a couple months ago, will reach back out to his gastroenterology office today for outpatient follow-up of his mild recent rectal bleeding though no evidence of acute pathology at this time.  Overall suspect dyspepsia as primary etiology of presentation, consider possibility of small internal hemorrhoids or bleeding polyps causing recent rectal bleeding though no evidence of any significant bleeding here, very trace blood on DRE no indication of upper GI bleed at this time.  Plan for PMD and GI follow-up as well as pulmonary nodule clinic follow-up.  Patient is already taking OTC omeprazole  20 mg daily, recommend increasing to twice daily over the next 2 weeks.  Will also Rx Maalox.  Counseled to avoid NSAIDs.  ED return precautions in place.  Patient agrees with plan. [MM]    Clinical Course User Index [MM] Clarine Ozell LABOR, MD     FINAL CLINICAL IMPRESSION(S) / ED DIAGNOSES   Final diagnoses:  Epigastric pain  Rectal bleeding  Pulmonary nodule     Rx / DC Orders   ED Discharge Orders          Ordered    AMB  Referral to Pulmonary Nodule Clinic        12/25/23 1512             Note:  This document was prepared using Dragon voice recognition software and may include unintentional dictation errors.   Clarine Ozell LABOR, MD 12/25/23 [phone]

## 2023-12-25 NOTE — Telephone Encounter (Signed)
 Called Patient as he was pulling in the parking lot so I put him in a room asked ore questions then talked to [employer] which stated he needs to go to the ED. Patient stated he has had bleeding with some blood clots from his rectum x 3 days with upper stomach pain. The upper stomach pain is so bad it hurts to barely touch it. Patient was going home to get wife and go to the ED.

## 2023-12-28 ENCOUNTER — Other Ambulatory Visit: Payer: Self-pay | Admitting: Psychiatry

## 2023-12-28 DIAGNOSIS — G4700 Insomnia, unspecified: Secondary | ICD-10-CM

## 2023-12-30 ENCOUNTER — Ambulatory Visit: Admitting: Nurse Practitioner

## 2023-12-31 DIAGNOSIS — J019 Acute sinusitis, unspecified: Secondary | ICD-10-CM | POA: Insufficient documentation

## 2023-12-31 NOTE — Assessment & Plan Note (Signed)
 Patient BP  Vitals:   12/18/23 0823 12/18/23 0838  BP: (!) 144/86 (!) 144/84    in the office  -Advised pt to follow a low sodium and heart healthy diet. - Advised patient to check blood pressure at home monitoring readings at next visit.

## 2023-12-31 NOTE — Assessment & Plan Note (Signed)
 Lab Results  Component Value Date   HGBA1C 5.2 12/18/2023

## 2023-12-31 NOTE — Assessment & Plan Note (Addendum)
 Persistent nasal congestion, thick yellow discharge, sinus headache. Allergies suspected. - Prescribe doxycycline.

## 2024-01-01 ENCOUNTER — Encounter: Payer: Self-pay | Admitting: Nurse Practitioner

## 2024-01-01 ENCOUNTER — Ambulatory Visit (INDEPENDENT_AMBULATORY_CARE_PROVIDER_SITE_OTHER): Admitting: Nurse Practitioner

## 2024-01-01 VITALS — BP 128/80 | HR 94 | Temp 97.7°F | Ht 63.0 in | Wt 174.0 lb

## 2024-01-01 DIAGNOSIS — K219 Gastro-esophageal reflux disease without esophagitis: Secondary | ICD-10-CM | POA: Diagnosis not present

## 2024-01-01 DIAGNOSIS — K625 Hemorrhage of anus and rectum: Secondary | ICD-10-CM

## 2024-01-01 NOTE — Progress Notes (Signed)
 Established Patient Office Visit  Subjective:  Patient ID: Tony Gerhold., male    DOB: Jul 15, 1965  Age: 58 y.o. MRN: [id number]  CC:  Chief Complaint  Patient presents with   Follow-up   Discussed the use of a AI scribe software for clinical note transcription with the patient, who gave verbal consent to proceed.  HPI  Tony Milstein. is a 58 year old male who presents for follow-up after an ER visit for rectal bleeding.  Rectal bleeding began two weeks ago, initially spotty, progressing to darker blood with clots a week before the ER visit on October 9th. Since Sunday, there has been no significant bleeding or blood in the stool, though some leakage is present. No history of hemorrhoids, and ER evaluation showed no hemorrhoids or ulcers.  He experiences intermittent epigastric discomfort, occasionally sore. He takes omeprazole twice daily for acid reflux and has eliminated red meat and soft drinks from his diet.  HPI   Past Medical History:  Diagnosis Date   Anxiety    Brain tumor (benign) (HCC)    Hypertension     Past Surgical History:  Procedure Laterality Date   BRAIN SURGERY     HIP SURGERY     NECK SURGERY     VENTRICULOPERITONEAL SHUNT      Family History  Problem Relation Age of Onset   Kidney cancer Father    Hypertension Father    Hyperlipidemia Father    Heart attack Father    Heart disease Father    Hyperlipidemia Paternal Grandmother    Hypertension Paternal Grandmother    Heart attack Paternal Grandfather    Stroke Paternal Grandfather    Heart failure Paternal Grandfather    Drug abuse Son    Stroke Paternal Uncle    Heart failure Paternal Uncle    Early death Paternal Uncle     Social History   Socioeconomic History   Marital status: Married    Spouse name: amanda   Number of children: 1   Years of education: Not on file   Highest education level: High school graduate  Occupational History   Occupation: self  Tobacco Use    Smoking status: Former    Current packs/day: 0.00    Types: Cigarettes    Quit date: 2014    Years since quitting: 11.8   Smokeless tobacco: Never  Vaping Use   Vaping status: Never Used  Substance and Sexual Activity   Alcohol  use: Not Currently   Drug use: Not Currently   Sexual activity: Yes  Other Topics Concern   Not on file  Social History Narrative   Not on file   Social Drivers of Health   Financial Resource Strain: Low Risk  (10/02/2023)   Received from [facility]   Overall Financial Resource Strain (CARDIA)    Difficulty of Paying Living Expenses: Not hard at all  Food Insecurity: No Food Insecurity (10/02/2023)   Received from [facility]   Hunger Vital Sign    Within the past 12 months, you worried that your food would run out before you got the money to buy more.: Never true    Within the past 12 months, the food you bought just didn't last and you didn't have money to get more.: Never true  Transportation Needs: No Transportation Needs (10/02/2023)   Received from [facility] - Transportation    In the past 12 months, has  lack of transportation kept you from medical appointments or from getting medications?: No    Lack of Transportation (Non-Medical): No  Physical Activity: Not on file  Stress: Not on file  Social Connections: Not on file  Intimate Partner Violence: Not on file     Outpatient Medications Prior to Visit  Medication Sig Dispense Refill   albuterol  (VENTOLIN  HFA) 108 (90 Base) MCG/ACT inhaler TAKE 2 PUFFS BY MOUTH EVERY 6 HOURS AS NEEDED FOR WHEEZE OR SHORTNESS OF BREATH 8.5 each 1   busPIRone  (BUSPAR ) 10 MG tablet TAKE 1 TABLET BY MOUTH TWICE A DAY 180 tablet 1   diazepam  (VALIUM ) 10 MG tablet Take 1 tablet (10 mg total) by mouth as directed for 1 dose. Take 1 tablet 20 minutes before your procedure ( consult with cardiology prior to taking ) 1 tablet 0   diazepam  (VALIUM ) 5 MG  tablet Take 1 tablet (5 mg total) by mouth once as needed for up to 1 dose for anxiety. 30 tablet 0   diphenhydrAMINE  (BENADRYL ) 50 MG tablet Take 1 tablet (50 mg total) by mouth once for 1 dose. 1 hour prior to test 1 tablet 0   escitalopram  (LEXAPRO ) 10 MG tablet Take 1 tablet (10 mg total) by mouth daily. 90 tablet 1   ivabradine  (CORLANOR) 7.5 MG TABS tablet Take 15 mg by mouth once.     losartan -hydrochlorothiazide (HYZAAR) 100-12.5 MG tablet Take 1 tablet by mouth daily. 90 tablet 3   metoprolol  succinate (TOPROL -XL) 25 MG 24 hr tablet TAKE 1 TABLET BY MOUTH EVERY DAY 30 tablet 5   metoprolol  tartrate (LOPRESSOR ) 100 MG tablet Take 1 tablet (100mg ) TWO hours prior to CT scan 1 tablet 0   metoprolol  tartrate (LOPRESSOR ) 50 MG tablet TAKE 1 TABLET 2 HR PRIOR TO CARDIAC PROCEDURE 1 tablet 0   predniSONE  (DELTASONE ) 50 MG tablet Take 1 tablet by mouth 13 hrs prior to test, another tablet 7 hours prior, and last dose 1 hour prior. 3 tablet 0   rosuvastatin  (CRESTOR ) 10 MG tablet Take 1 tablet (10 mg total) by mouth daily. 90 tablet 3   Semaglutide ,0.25 or 0.5MG /DOS, (OZEMPIC , 0.25 OR 0.5 MG/DOSE,) 2 MG/3ML SOPN INJECT 0.5 MG INTO THE SKIN ONE TIME PER WEEK 3 mL 3   traZODone  (DESYREL ) 50 MG tablet TAKE 0.5-1 TABLETS BY MOUTH AT BEDTIME AS NEEDED FOR SLEEP. 30 tablet 4   No facility-administered medications prior to visit.    Allergies  Allergen Reactions   Ivp Dye [Iodinated Contrast Media]    Penicillins     ROS Review of Systems Negative unless indicated in HPI.    Objective:    Physical Exam Constitutional:      Appearance: Normal appearance.  HENT:     Mouth/Throat:     Mouth: Mucous membranes are moist.  Eyes:     Conjunctiva/sclera: Conjunctivae normal.     Pupils: Pupils are equal, round, and reactive to light.  Cardiovascular:     Rate and Rhythm: Normal rate and regular rhythm.     Pulses: Normal pulses.     Heart sounds: Normal heart sounds.  Pulmonary:      Effort: Pulmonary effort is normal.     Breath sounds: Normal breath sounds.  Abdominal:     General: Bowel sounds are normal.     Palpations: Abdomen is soft. There is no mass.     Tenderness: There is abdominal tenderness in the epigastric area. There is no rebound.  Musculoskeletal:  Cervical back: Normal range of motion. No tenderness.  Skin:    General: Skin is warm.     Findings: No bruising.  Neurological:     General: No focal deficit present.     Mental Status: He is alert and oriented to person, place, and time. Mental status is at baseline.  Psychiatric:        Mood and Affect: Mood normal.        Behavior: Behavior normal.        Thought Content: Thought content normal.        Judgment: Judgment normal.     BP 128/80   Pulse 94   Temp 97.7 F (36.5 C)   Ht 5' 3 (1.6 m)   Wt 174 lb (78.9 kg)   SpO2 99%   BMI 30.82 kg/m  Wt Readings from Last 3 Encounters:  01/01/24 174 lb (78.9 kg)  12/25/23 165 lb (74.8 kg)  12/18/23 170 lb 6.4 oz (77.3 kg)     Health Maintenance  Topic Date Due   COVID-19 Vaccine (1) Never done   OPHTHALMOLOGY EXAM  Never done   HIV Screening  Never done   Diabetic kidney evaluation - Urine ACR  Never done   Hepatitis C Screening  Never done   DTaP/Tdap/Td (1 - Tdap) Never done   Pneumococcal Vaccine: 50+ Years (1 of 2 - PCV) Never done   Hepatitis B Vaccines 19-59 Average Risk (1 of 3 - 19+ 3-dose series) Never done   Zoster Vaccines- Shingrix (1 of 2) Never done   Colonoscopy  03/01/2023   Influenza Vaccine  06/15/2024 (Originally 10/17/2023)   FOOT EXAM  04/21/2024   HEMOGLOBIN A1C  06/17/2024   Diabetic kidney evaluation - eGFR measurement  12/24/2024   HPV VACCINES  Aged Out   Meningococcal B Vaccine  Aged Out       Topic Date Due   Hepatitis B Vaccines 19-59 Average Risk (1 of 3 - 19+ 3-dose series) Never done    Lab Results  Component Value Date   TSH 0.71 10/29/2023   Lab Results  Component Value Date   WBC  7.4 12/25/2023   HGB 14.0 12/25/2023   HCT 42.1 12/25/2023   MCV 89.8 12/25/2023   PLT 224 12/25/2023   Lab Results  Component Value Date   NA 139 12/25/2023   K 4.2 12/25/2023   CO2 23 12/25/2023   GLUCOSE 83 12/25/2023   BUN 17 12/25/2023   CREATININE 0.91 12/25/2023   BILITOT 0.5 12/25/2023   ALKPHOS 58 12/25/2023   AST 19 12/25/2023   ALT 32 12/25/2023   PROT 7.3 12/25/2023   ALBUMIN 4.3 12/25/2023   CALCIUM  9.2 12/25/2023   ANIONGAP 12 12/25/2023   EGFR 94 03/01/2022   GFR 66.76 04/22/2023   Lab Results  Component Value Date   CHOL 159 10/29/2023   Lab Results  Component Value Date   HDL 42.50 10/29/2023   Lab Results  Component Value Date   LDLCALC 83 10/29/2023   Lab Results  Component Value Date   TRIG 166.0 (H) 10/29/2023   Lab Results  Component Value Date   CHOLHDL 4 10/29/2023   Lab Results  Component Value Date   HGBA1C 5.2 12/18/2023      Assessment & Plan:  Rectal bleeding Assessment & Plan: Rectal bleeding decreased significantly. No hemorrhoids or ulcers found. CT scan unremarkable. - Referral sent for diagnostic colonoscopy.  Orders: -     Ambulatory referral to  Gastroenterology  Gastroesophageal reflux disease, unspecified whether esophagitis present Assessment & Plan: Upper stomach discomfort and acid reflux managed with omeprazole and dietary modifications. - Continue omeprazole and diet management      Follow-up: Return in about 3 months (around 04/02/2024) for chronic management.   Izyk Marty, NP

## 2024-01-06 ENCOUNTER — Telehealth (HOSPITAL_COMMUNITY): Payer: Self-pay | Admitting: Emergency Medicine

## 2024-01-06 ENCOUNTER — Encounter (HOSPITAL_COMMUNITY): Payer: Self-pay

## 2024-01-06 NOTE — Telephone Encounter (Signed)
 Reaching out to patient to offer assistance regarding upcoming cardiac imaging study; pt verbalizes understanding of appt date/time, parking situation and where to check in, pre-test NPO status and medications ordered, and verified current allergies; name and call back number provided for further questions should they arise Tony Shutter RN Navigator Cardiac Imaging [name] [phone] office [phone] cell  Pt taking 100mg  metoprolol  +15mg  ivabradine  + valium  + 13hr prep for CCTA

## 2024-01-08 ENCOUNTER — Ambulatory Visit
Admission: RE | Admit: 2024-01-08 | Discharge: 2024-01-08 | Disposition: A | Discharge status: Home / Self Care | Source: Ambulatory Visit

## 2024-01-08 ENCOUNTER — Other Ambulatory Visit: Payer: Self-pay | Admitting: Cardiology

## 2024-01-08 ENCOUNTER — Ambulatory Visit: Payer: Self-pay

## 2024-01-08 ENCOUNTER — Ambulatory Visit
Admission: RE | Admit: 2024-01-08 | Discharge: 2024-01-08 | Disposition: A | Discharge status: Home / Self Care | Source: Ambulatory Visit | Attending: Cardiology | Admitting: Cardiology

## 2024-01-08 DIAGNOSIS — R931 Abnormal findings on diagnostic imaging of heart and coronary circulation: Secondary | ICD-10-CM

## 2024-01-08 DIAGNOSIS — R072 Precordial pain: Secondary | ICD-10-CM | POA: Insufficient documentation

## 2024-01-08 MED ORDER — IOHEXOL 350 MG/ML SOLN
100.0000 mL | Freq: Once | INTRAVENOUS | Status: AC | PRN
  Administered 2024-01-08: 100 mL via INTRAVENOUS

## 2024-01-08 MED ORDER — DILTIAZEM HCL 25 MG/5ML IV SOLN: 10.0000 mg | INTRAVENOUS | Status: DC | PRN

## 2024-01-08 MED ORDER — METOPROLOL TARTRATE 5 MG/5ML IV SOLN
10.0000 mg | Freq: Once | INTRAVENOUS | Status: AC | PRN
  Administered 2024-01-08: 10 mg via INTRAVENOUS

## 2024-01-08 MED ORDER — NITROGLYCERIN 0.4 MG SL SUBL
0.8000 mg | SUBLINGUAL_TABLET | Freq: Once | SUBLINGUAL | Status: AC
  Administered 2024-01-08: 0.8 mg via SUBLINGUAL
  Filled 2024-01-08: qty 25

## 2024-01-08 MED ORDER — METOPROLOL TARTRATE 5 MG/5ML IV SOLN
INTRAVENOUS | Status: AC
  Filled 2024-01-08: qty 10

## 2024-01-08 NOTE — Progress Notes (Signed)
 Patient tolerated scan.  PRNs given pre orders.Vitals stable. Patient educated to drink fluids throughout day.

## 2024-01-12 DIAGNOSIS — K219 Gastro-esophageal reflux disease without esophagitis: Secondary | ICD-10-CM | POA: Insufficient documentation

## 2024-01-12 NOTE — Assessment & Plan Note (Signed)
 Rectal bleeding decreased significantly. No hemorrhoids or ulcers found. CT scan unremarkable. - Referral sent for diagnostic colonoscopy.

## 2024-01-12 NOTE — Telephone Encounter (Signed)
 Pended the Diazepam  for your review.

## 2024-01-12 NOTE — Assessment & Plan Note (Signed)
 Upper stomach discomfort and acid reflux managed with omeprazole and dietary modifications. - Continue omeprazole and diet management

## 2024-01-13 ENCOUNTER — Telehealth: Payer: Self-pay | Admitting: Nurse Practitioner

## 2024-01-13 MED ORDER — DIAZEPAM 5 MG PO TABS: 5.0000 mg | ORAL_TABLET | Freq: Once | ORAL | 0 refills | Status: DC | PRN

## 2024-01-13 NOTE — Telephone Encounter (Signed)
 Please call pharmacy to clarify

## 2024-01-13 NOTE — Telephone Encounter (Signed)
 Error

## 2024-01-13 NOTE — Telephone Encounter (Signed)
 Pt walked into the office needing his Rx changed because the pharmacy is showing that he has 2  Rx for Diazepam  which he never picked up the medication from Dr. Dr. Saramma. We need to get that Rx cancelled so he can get the pt his proper amount of medication. Please advise.

## 2024-01-13 NOTE — Telephone Encounter (Signed)
Controlled substance database reviewed.  Medication sent to pharmacy.

## 2024-01-13 NOTE — Telephone Encounter (Signed)
 diazepam (VALIUM) 10 MG tablet

## 2024-01-14 NOTE — Telephone Encounter (Signed)
 Diazepam  10 mg has been cancelled at [facility].

## 2024-01-15 NOTE — Telephone Encounter (Signed)
 Pharmacy said he can not pick up the 5 mg of Diazepam  until 01/24/24.

## 2024-01-16 ENCOUNTER — Other Ambulatory Visit: Payer: Self-pay | Admitting: Nurse Practitioner

## 2024-01-16 MED ORDER — DIAZEPAM 10 MG PO TABS: 10.0000 mg | ORAL_TABLET | Freq: Every evening | ORAL | 0 refills | Status: DC | PRN

## 2024-01-16 NOTE — Progress Notes (Signed)
Controlled substance database reviewed.  Medication sent to pharmacy.

## 2024-02-03 ENCOUNTER — Ambulatory Visit

## 2024-02-08 NOTE — Progress Notes (Deleted)
  Cardiology Office Note   Date:  02/08/2024  ID:  Tony Salemi., DOB [birth year], MRN [id number] PCP: Vincente Saber, NP   HeartCare Providers Cardiologist:  Caron Poser, MD     History of Present Illness [name]. is a 58 y.o. male PMH HTN, HLD, DM 2 who presents for further evaluation of intermittent chest pain.  Patient was seen in the ED on 11/09/2023 for chest pain.  He ruled out for ACS with normal ECG and negative troponin.  Notably, he was seen back in the ED on 10/02/23 with dehydration and AKI and had mildly elevated but flat troponins 24-28.  Patient reports he has been dealing with quite a bit of stress and anxiety lately.  He notes that he is frequently having chest pain and shortness of breath.  The symptoms are often occurring with exertion and resolving with rest.  Sometimes the symptoms do occur at rest.  He reports a family history of several first-degree relatives having MIs in their 31s.  He also notes that his blood pressures have been higher and difficult to control at home.  Last LDL 83 10/2023  Interval history: Since last visit, we obtained a coronary CTA which showed moderate RCA stenosis that was insignificant by CT FFR.***  Relevant CVD History -Coronary CTA 01/15/2024 moderate proximal RCA stenosis, mild proximal LAD stenosis, CT FFR negative. -Zio monitor 11/18/2023 mean heart rate 85 bpm, 3 brief episodes of SVT (longest 5 beats), no arrhythmias  ROS: Pt denies any jaw pain, arm pain, syncope, presyncope, orthopnea, PND, or LE edema.  Studies Reviewed I have independently reviewed the patient's ECG, zio monitor, and recent bloodwork.  Physical Exam VS:  There were no vitals taken for this visit.       Wt Readings from Last 3 Encounters:  01/01/24 174 lb (78.9 kg)  12/25/23 165 lb (74.8 kg)  12/18/23 170 lb 6.4 oz (77.3 kg)    GEN: No acute distress. NECK: No JVD; No carotid bruits. CARDIAC: RRR, no murmurs, rubs,  gallops. RESPIRATORY:  Clear to auscultation. EXTREMITIES:  Warm and well-perfused. No edema.  ASSESSMENT AND PLAN Nonobstructive CAD Coronary CT angiogram 12/2023 moderate proximal RCA disease, mild proximal LAD disease.  CT FFR negative.  Plan: - Continue ASA 81 mg daily - Continue Crestor  10 mg daily  HLD LDL 83 04/2023.  LDL goal less than 70 given nonobstructive CAD and diabetes.  Started Crestor  at last visit.  Will recheck lipids today and uptitrate to meet goal.  Plan: -Stop fibrate, start crestor  10mg  every day -Recheck lipids next visit  HTN Elevated on last several medical encounters. He also notes they have been consistently elevated at home.  Plan: -Increase to losartan  100-hydrochlorothiazide 12.5 mg every day; consider upritration and adding norvasc  if we cannot get him 130/80 or better -If we get to that point, then we should do a secondary hypertension workup with sleep study, renin-aldosterone assay, and renal artery US         Dispo: RTC 3 months  Signed, Caron Poser, MD

## 2024-02-10 ENCOUNTER — Ambulatory Visit

## 2024-02-11 NOTE — Progress Notes (Signed)
 Cardiology Office Note   Date:  02/17/2024  ID:  [name]., DOB Aug 13, 1965, MRN [id number] PCP: Vincente Saber, NP  [facility] Providers Cardiologist:  Caron Poser, MD     History of Present Illness [name]. is a 58 y.o. male PMH HTN, HLD, DM 2 who presents for further evaluation of intermittent chest pain.  Patient was seen in the ED on 11/09/2023 for chest pain.  He ruled out for ACS with normal ECG and negative troponin.  Notably, he was seen back in the ED on 10/02/23 with dehydration and AKI and had mildly elevated but flat troponins 24-28.  Patient reports he has been dealing with quite a bit of stress and anxiety lately.  He notes that he is frequently having chest pain and shortness of breath.  The symptoms are often occurring with exertion and resolving with rest.  Sometimes the symptoms do occur at rest.  He reports a family history of several first-degree relatives having MIs in their 43s.  He also notes that his blood pressures have been higher and difficult to control at home.  Last LDL 83 10/2023  Interval history: Since last visit, we obtained a coronary CTA which showed moderate RCA stenosis that was insignificant by CT FFR.  He notes that the symptoms he was having during his first visit have essentially resolved now that he has his anxiety under control.  Relevant CVD History -Coronary CTA 01/15/2024 moderate proximal RCA stenosis, mild proximal LAD stenosis, CT FFR negative. -Zio monitor 11/18/2023 mean heart rate 85 bpm, 3 brief episodes of SVT (longest 5 beats), no arrhythmias  ROS: Pt denies any jaw pain, arm pain, syncope, presyncope, orthopnea, PND, or LE edema.  Studies Reviewed I have independently reviewed the patient's ECG, cardiac CT scan, zio monitor, and recent bloodwork.  Physical Exam VS:  BP (!) 148/88 (BP Location: Left Arm, Patient Position: Sitting, Cuff Size: Normal)   Pulse 86   Ht 5' 3 (1.6 m)   Wt 179 lb 3.2 oz  (81.3 kg)   SpO2 93%   BMI 31.74 kg/m        Wt Readings from Last 3 Encounters:  02/17/24 179 lb 3.2 oz (81.3 kg)  01/01/24 174 lb (78.9 kg)  12/25/23 165 lb (74.8 kg)    GEN: No acute distress. NECK: No JVD; No carotid bruits. CARDIAC: RRR, no murmurs, rubs, gallops. RESPIRATORY:  Clear to auscultation. EXTREMITIES:  Warm and well-perfused. No edema.  ASSESSMENT AND PLAN Nonobstructive CAD Coronary CT angiogram 12/2023 moderate proximal RCA disease, mild proximal LAD disease.  CT FFR negative.  He has not had any further chest pain since getting his anxiety under control.  Plan: - Continue ASA 81 mg daily - Continue Crestor  10 mg daily  HLD LDL 83 04/2023.  LDL goal less than 70 given nonobstructive CAD and diabetes.  Started Crestor  at last visit.  Will recheck lipids today and uptitrate to meet goal.  Plan: -Continue crestor  10mg  every day -Recheck lipids today  HTN He notes that his home BPs tend to be much better controlled in office.  On review of chart, he has intermittent controlled and slightly elevated blood pressures during medical encounters.  I encouraged him to keep checking home BPs.  Plan: - Continue losartan  100-hydrochlorothiazide 12.5 mg every day; I encouraged him to continue checking home blood pressures.  I asked him to reach out to me and we will consider upritration and adding norvasc  if he is  consistently above 130/80 -If we get to that point, then we should do a secondary hypertension workup with sleep study, renin-aldosterone assay, and renal artery US         Dispo: RTC 12 months or sooner as needed  Signed, Caron Poser, MD

## 2024-02-17 ENCOUNTER — Other Ambulatory Visit: Payer: Self-pay

## 2024-02-17 ENCOUNTER — Ambulatory Visit

## 2024-02-17 ENCOUNTER — Encounter: Payer: Self-pay | Admitting: Nurse Practitioner

## 2024-02-17 VITALS — BP 148/88 | HR 86 | Ht 63.0 in | Wt 179.2 lb

## 2024-02-17 DIAGNOSIS — I2510 Atherosclerotic heart disease of native coronary artery without angina pectoris: Secondary | ICD-10-CM | POA: Diagnosis not present

## 2024-02-17 DIAGNOSIS — E782 Mixed hyperlipidemia: Secondary | ICD-10-CM

## 2024-02-17 DIAGNOSIS — I10 Essential (primary) hypertension: Secondary | ICD-10-CM

## 2024-02-17 MED ORDER — DIAZEPAM 5 MG PO TABS: 5.0000 mg | ORAL_TABLET | Freq: Once | ORAL | 0 refills | Status: DC | PRN

## 2024-02-17 NOTE — Patient Instructions (Signed)
 Medication Instructions:  Your physician recommends that you continue on your current medications as directed. Please refer to the Current Medication list given to you today.  *If you need a refill on your cardiac medications before your next appointment, please call your pharmacy*  Lab Work: Your provider would like for you to have following labs drawn today LIPID PANEL.   If you have labs (blood work) drawn today and your tests are completely normal, you will receive your results only by: MyChart Message (if you have MyChart) OR A paper copy in the mail If you have any lab test that is abnormal or we need to change your treatment, we will call you to review the results.  Testing/Procedures: No test ordered today   Follow-Up: At [facility], you and your health needs are our priority.  As part of our continuing mission to provide you with exceptional heart care, our providers are all part of one team.  This team includes your primary Cardiologist (physician) and Advanced Practice Providers or APPs (Physician Assistants and Nurse Practitioners) who all work together to provide you with the care you need, when you need it.  Your next appointment:   12 month(s)  Provider: Caron Downy, MD   We recommend signing up for the patient portal called MyChart.  Sign up information is provided on this After Visit Summary.  MyChart is used to connect with patients for Virtual Visits (Telemedicine).  Patients are able to view lab/test results, encounter notes, upcoming appointments, etc.  Non-urgent messages can be sent to your provider as well.   To learn more about what you can do with MyChart, go to [email or web address].

## 2024-02-17 NOTE — Progress Notes (Signed)
Controlled substance database reviewed.  Medication sent to pharmacy.

## 2024-02-18 ENCOUNTER — Other Ambulatory Visit: Payer: Self-pay | Admitting: *Deleted

## 2024-02-18 ENCOUNTER — Ambulatory Visit: Payer: Self-pay

## 2024-02-18 LAB — LIPID PANEL
Chol/HDL Ratio: 4.1 ratio (ref 0.0–5.0)
Cholesterol, Total: 173 mg/dL (ref 100–199)
HDL: 42 mg/dL (ref >39)
LDL Chol Calc (NIH): 72 mg/dL (ref 0–99)
Triglycerides: 373 mg/dL — ABNORMAL HIGH (ref 0–149)
VLDL Cholesterol Cal: 59 mg/dL — ABNORMAL HIGH (ref 5–40)

## 2024-02-18 MED ORDER — ROSUVASTATIN CALCIUM 20 MG PO TABS: 20.0000 mg | ORAL_TABLET | Freq: Every day | ORAL | 3 refills | Status: AC

## 2024-03-04 ENCOUNTER — Other Ambulatory Visit: Payer: Self-pay | Admitting: Nurse Practitioner

## 2024-03-09 ENCOUNTER — Other Ambulatory Visit: Payer: Self-pay

## 2024-03-09 MED ORDER — METOPROLOL SUCCINATE ER 25 MG PO TB24: 25.0000 mg | ORAL_TABLET | Freq: Every day | ORAL | 5 refills | Status: AC

## 2024-03-18 ENCOUNTER — Other Ambulatory Visit: Payer: Self-pay | Admitting: Family

## 2024-03-18 ENCOUNTER — Encounter: Payer: Self-pay | Admitting: Nurse Practitioner

## 2024-03-18 DIAGNOSIS — F411 Generalized anxiety disorder: Secondary | ICD-10-CM

## 2024-03-19 MED ORDER — DIAZEPAM 5 MG PO TABS: 5.0000 mg | ORAL_TABLET | Freq: Once | ORAL | 0 refills | Status: DC | PRN

## 2024-03-19 NOTE — Telephone Encounter (Signed)
 Pt requesting refill.  Diazepam  5mg  Last Visit: 01/01/2024 Next Visit: 04/02/2024 Last Refill: E-Prescribing Status: Receipt confirmed by pharmacy (02/17/2024  4:20 PM EST)   Please Advise

## 2024-03-22 ENCOUNTER — Other Ambulatory Visit: Payer: Self-pay | Admitting: Family

## 2024-03-22 ENCOUNTER — Other Ambulatory Visit: Payer: Self-pay

## 2024-03-22 DIAGNOSIS — F411 Generalized anxiety disorder: Secondary | ICD-10-CM

## 2024-03-22 MED ORDER — ESCITALOPRAM OXALATE 10 MG PO TABS: 10.0000 mg | ORAL_TABLET | Freq: Every day | ORAL | 1 refills | Status: AC

## 2024-03-24 ENCOUNTER — Ambulatory Visit: Admitting: Psychiatry

## 2024-04-02 ENCOUNTER — Encounter: Payer: Self-pay | Admitting: Nurse Practitioner

## 2024-04-02 ENCOUNTER — Ambulatory Visit: Payer: PRIVATE HEALTH INSURANCE | Admitting: Nurse Practitioner

## 2024-04-02 VITALS — BP 122/76 | HR 101 | Temp 98.5°F | Ht 63.0 in | Wt 183.2 lb

## 2024-04-02 DIAGNOSIS — Z23 Encounter for immunization: Secondary | ICD-10-CM

## 2024-04-02 DIAGNOSIS — J0110 Acute frontal sinusitis, unspecified: Secondary | ICD-10-CM

## 2024-04-02 DIAGNOSIS — I10 Essential (primary) hypertension: Secondary | ICD-10-CM

## 2024-04-02 DIAGNOSIS — F410 Panic disorder [episodic paroxysmal anxiety] without agoraphobia: Secondary | ICD-10-CM

## 2024-04-02 DIAGNOSIS — F32A Depression, unspecified: Secondary | ICD-10-CM

## 2024-04-02 DIAGNOSIS — E785 Hyperlipidemia, unspecified: Secondary | ICD-10-CM

## 2024-04-02 MED ORDER — DOXYCYCLINE HYCLATE 100 MG PO TABS: 100.0000 mg | ORAL_TABLET | Freq: Two times a day (BID) | ORAL | 0 refills | Status: AC

## 2024-04-02 NOTE — Assessment & Plan Note (Signed)
 [name]

## 2024-04-02 NOTE — Assessment & Plan Note (Addendum)
" °  °  Assessment and Plan    Acute sinusitis Sinus infection with persistent symptoms for three weeks. - Prescribed doxycycline , advised sun exposure caution. - Recommended steam baths. - Continue Flonase nasal spray daily.  Major depressive disorder On Lexapro  and therapy. - Continue Lexapro  20 mg daily. - Continue therapy with Alan Collet.  Panic disorder Managed with Buspar  and diazepam , psychiatrist to review diazepam  use. - Continue Buspar  10 mg twice daily. - Continue diazepam  5 mg daily until psychiatrist appointment. - Discuss diazepam  continuation with psychiatrist.  Hypertension Well-controlled with current regimen, recent BP 126/70 mmHg. - Continue losartan , hydrochlorothiazide, and metoprolol .  Hyperlipidemia Managed with rosuvastatin , recent dosage increase and aspirin  addition. - Continue rosuvastatin  10 mg daily. - Continue aspirin  as per cardiology recommendation.       "

## 2024-04-02 NOTE — Progress Notes (Unsigned)
 "  Established Patient Office Visit  Subjective:  Patient ID: [name]., male    DOB: May 21, 1965  Age: 59 y.o. MRN: [id number]  CC:  Chief Complaint  Patient presents with   Medical Management of Chronic Issues    Sinus Pain & pressure since xmas   Discussed the use of AI scribe software for clinical note transcription with the patient, who gave verbal consent to proceed.  History of Present Illness   History of Present Illness   [name]. is a 59 year old male with hypertension and coronary artery disease who presents with sinus infection symptoms.  He reports about three weeks of sinus symptoms starting around Christmas, with sinus pressure, headaches that can progress to migraines, and green productive nasal drainage. Symptoms are significantly bothersome. He uses Flonase daily. He is allergic to penicillin.  Current medications include diazepam , Buspar , Lexapro , losartan , hydrochlorothiazide, metoprolol , rosuvastatin , Ozempic , and aspirin . His rosuvastatin  dose was recently increased and aspirin  was started after a cardiology visit.  His blood pressure has been stable around 122/76 mmHg. He denies chest pain, shortness of breath, abdominal pain, leg swelling, fever, or changes in bowel movements.       Past Medical History:  Diagnosis Date   Anxiety    Brain tumor (benign) (HCC)    Hypertension     Past Surgical History:  Procedure Laterality Date   BRAIN SURGERY     HIP SURGERY     NECK SURGERY     VENTRICULOPERITONEAL SHUNT      Family History  Problem Relation Age of Onset   Kidney cancer Father    Hypertension Father    Hyperlipidemia Father    Heart attack Father    Heart disease Father    Hyperlipidemia Paternal Grandmother    Hypertension Paternal Grandmother    Heart attack Paternal Grandfather    Stroke Paternal Grandfather    Heart failure Paternal Grandfather    Drug abuse Son    Stroke Paternal Uncle    Heart failure  Paternal Uncle    Early death Paternal Uncle     Social History   Socioeconomic History   Marital status: Married    Spouse name: amanda   Number of children: 1   Years of education: Not on file   Highest education level: High school graduate  Occupational History   Occupation: self  Tobacco Use   Smoking status: Former    Current packs/day: 0.00    Types: Cigarettes    Quit date: 2014    Years since quitting: 12.0   Smokeless tobacco: Never  Vaping Use   Vaping status: Never Used  Substance and Sexual Activity   Alcohol  use: Not Currently   Drug use: Not Currently   Sexual activity: Yes  Other Topics Concern   Not on file  Social History Narrative   Not on file   Social Drivers of Health   Tobacco Use: Medium Risk (04/02/2024)   Patient History    Smoking Tobacco Use: Former    Smokeless Tobacco Use: Never    Passive Exposure: Not on file  Financial Resource Strain: Low Risk  (10/02/2023)   Received from [facility]   Overall Financial Resource Strain (CARDIA)    Difficulty of Paying Living Expenses: Not hard at all  Food Insecurity: No Food Insecurity (10/02/2023)   Received from [facility]   Epic    Within the past 12 months, you worried that  your food would run out before you got the money to buy more.: Never true    Within the past 12 months, the food you bought just didn't last and you didn't have money to get more.: Never true  Transportation Needs: No Transportation Needs (10/02/2023)   Received from [facility] - Transportation    In the past 12 months, has lack of transportation kept you from medical appointments or from getting medications?: No    Lack of Transportation (Non-Medical): No  Physical Activity: Not on file  Stress: Not on file  Social Connections: Not on file  Intimate Partner Violence: Not on file  Depression (PHQ2-9): Low Risk (04/02/2024)   Depression (PHQ2-9)    PHQ-2  Score: 4  Alcohol  Screen: Not on file  Housing: Unknown (10/02/2023)   Received from [facility]   Epic    In the last 12 months, was there a time when you were not able to pay the mortgage or rent on time?: No    Number of Times Moved in the Last Year: Not on file    At any time in the past 12 months, were you homeless or living in a shelter (including now)?: No  Utilities: Not At Risk (10/02/2023)   Received from [facility]   Epic    In the past 12 months has the electric, gas, oil, or water company threatened to shut off services in your home?: No  Health Literacy: Not on file     Outpatient Medications Prior to Visit  Medication Sig Dispense Refill   aspirin  EC 81 MG tablet Take 81 mg by mouth daily. Swallow whole.     busPIRone  (BUSPAR ) 10 MG tablet TAKE 1 TABLET BY MOUTH TWICE A DAY 180 tablet 1   diazepam  (VALIUM ) 5 MG tablet Take 1 tablet (5 mg total) by mouth once as needed for up to 1 dose for anxiety. 30 tablet 0   escitalopram  (LEXAPRO ) 10 MG tablet Take 1 tablet (10 mg total) by mouth daily. 90 tablet 1   losartan -hydrochlorothiazide (HYZAAR) 100-12.5 MG tablet Take 1 tablet by mouth daily. 90 tablet 3   metoprolol  succinate (TOPROL -XL) 25 MG 24 hr tablet Take 1 tablet (25 mg total) by mouth daily. 30 tablet 5   rosuvastatin  (CRESTOR ) 20 MG tablet Take 1 tablet (20 mg total) by mouth daily. 90 tablet 3   Semaglutide ,0.25 or 0.5MG /DOS, (OZEMPIC , 0.25 OR 0.5 MG/DOSE,) 2 MG/3ML SOPN INJECT 0.5 MG INTO THE SKIN ONE TIME PER WEEK 3 mL 3   albuterol  (VENTOLIN  HFA) 108 (90 Base) MCG/ACT inhaler TAKE 2 PUFFS BY MOUTH EVERY 6 HOURS AS NEEDED FOR WHEEZE OR SHORTNESS OF BREATH 8.5 each 1   diphenhydrAMINE  (BENADRYL ) 50 MG tablet Take 1 tablet (50 mg total) by mouth once for 1 dose. 1 hour prior to test 1 tablet 0   ivabradine  (CORLANOR) 7.5 MG TABS tablet Take 15 mg by mouth once.     metoprolol  tartrate (LOPRESSOR ) 100 MG tablet Take 1 tablet (100mg )  TWO hours prior to CT scan 1 tablet 0   metoprolol  tartrate (LOPRESSOR ) 50 MG tablet TAKE 1 TABLET 2 HR PRIOR TO CARDIAC PROCEDURE 1 tablet 0   predniSONE  (DELTASONE ) 50 MG tablet Take 1 tablet by mouth 13 hrs prior to test, another tablet 7 hours prior, and last dose 1 hour prior. 3 tablet 0   traZODone  (DESYREL ) 50 MG tablet TAKE 0.5-1 TABLETS BY MOUTH AT BEDTIME  AS NEEDED FOR SLEEP. 30 tablet 4   No facility-administered medications prior to visit.    Allergies[1]  ROS Review of Systems Negative unless indicated in HPI.    Objective:    Physical Exam Constitutional:      Appearance: Normal appearance.  HENT:     Right Ear: Tympanic membrane normal. Tympanic membrane is not erythematous.     Left Ear: Tympanic membrane normal. Tympanic membrane is not erythematous.     Nose:     Right Turbinates: Not enlarged.     Left Turbinates: Not enlarged.     Right Sinus: No maxillary sinus tenderness or frontal sinus tenderness.     Left Sinus: No maxillary sinus tenderness or frontal sinus tenderness.     Mouth/Throat:     Mouth: Mucous membranes are moist.     Pharynx: No pharyngeal swelling, oropharyngeal exudate or posterior oropharyngeal erythema.     Tonsils: No tonsillar exudate.  Eyes:     Conjunctiva/sclera: Conjunctivae normal.     Pupils: Pupils are equal, round, and reactive to light.  Cardiovascular:     Rate and Rhythm: Normal rate and regular rhythm.     Pulses: Normal pulses.     Heart sounds: Normal heart sounds.  Pulmonary:     Effort: Pulmonary effort is normal.     Breath sounds: Normal breath sounds. No stridor. No wheezing.  Abdominal:     General: Bowel sounds are normal.     Palpations: Abdomen is soft.  Musculoskeletal:     Cervical back: Normal range of motion. No tenderness.  Skin:    General: Skin is warm.     Findings: No bruising.  Neurological:     General: No focal deficit present.     Mental Status: He is alert and oriented to person,  place, and time. Mental status is at baseline.  Psychiatric:        Mood and Affect: Mood normal.        Behavior: Behavior normal.        Thought Content: Thought content normal.        Judgment: Judgment normal.     BP 122/76   Pulse (!) 101   Temp 98.5 F (36.9 C)   Ht 5' 3 (1.6 m)   Wt 183 lb 3.2 oz (83.1 kg)   SpO2 96%   BMI 32.45 kg/m  Wt Readings from Last 3 Encounters:  04/02/24 183 lb 3.2 oz (83.1 kg)  02/17/24 179 lb 3.2 oz (81.3 kg)  01/01/24 174 lb (78.9 kg)     Health Maintenance  Topic Date Due   OPHTHALMOLOGY EXAM  Never done   HIV Screening  Never done   Diabetic kidney evaluation - Urine ACR  Never done   Hepatitis C Screening  Never done   Pneumococcal Vaccine: 50+ Years (1 of 2 - PCV) Never done   Hepatitis B Vaccines 19-59 Average Risk (1 of 3 - 19+ 3-dose series) Never done   Zoster Vaccines- Shingrix (1 of 2) Never done   Colonoscopy  03/01/2023   COVID-19 Vaccine (1) 04/18/2024 (Originally 05/06/1966)   FOOT EXAM  04/21/2024   HEMOGLOBIN A1C  06/17/2024   Diabetic kidney evaluation - eGFR measurement  12/24/2024   DTaP/Tdap/Td (2 - Td or Tdap) 04/02/2034   Influenza Vaccine  Completed   HPV VACCINES (No Doses Required) Completed   Meningococcal B Vaccine  Aged Out       Topic Date Due   Hepatitis B  Vaccines 19-59 Average Risk (1 of 3 - 19+ 3-dose series) Never done    Lab Results  Component Value Date   TSH 0.71 10/29/2023   Lab Results  Component Value Date   WBC 7.4 12/25/2023   HGB 14.0 12/25/2023   HCT 42.1 12/25/2023   MCV 89.8 12/25/2023   PLT 224 12/25/2023   Lab Results  Component Value Date   NA 139 12/25/2023   K 4.2 12/25/2023   CO2 23 12/25/2023   GLUCOSE 83 12/25/2023   BUN 17 12/25/2023   CREATININE 0.91 12/25/2023   BILITOT 0.5 12/25/2023   ALKPHOS 58 12/25/2023   AST 19 12/25/2023   ALT 32 12/25/2023   PROT 7.3 12/25/2023   ALBUMIN 4.3 12/25/2023   CALCIUM  9.2 12/25/2023   ANIONGAP 12 12/25/2023    EGFR 94 03/01/2022   GFR 66.76 04/22/2023   Lab Results  Component Value Date   CHOL 173 02/17/2024   Lab Results  Component Value Date   HDL 42 02/17/2024   Lab Results  Component Value Date   LDLCALC 72 02/17/2024   Lab Results  Component Value Date   TRIG 373 (H) 02/17/2024   Lab Results  Component Value Date   CHOLHDL 4.1 02/17/2024   Lab Results  Component Value Date   HGBA1C 5.2 12/18/2023      Assessment & Plan:   Assessment & Plan Depression, unspecified depression type     Panic attack    Assessment and Plan    Acute sinusitis Sinus infection with persistent symptoms for three weeks. - Prescribed doxycycline , advised sun exposure caution. - Recommended steam baths. - Continue Flonase nasal spray daily.  Major depressive disorder On Lexapro  and therapy. - Continue Lexapro  20 mg daily. - Continue therapy with Alan Collet.  Panic disorder Managed with Buspar  and diazepam , psychiatrist to review diazepam  use. - Continue Buspar  10 mg twice daily. - Continue diazepam  5 mg daily until psychiatrist appointment. - Discuss diazepam  continuation with psychiatrist.  Hypertension Well-controlled with current regimen, recent BP 126/70 mmHg. - Continue losartan , hydrochlorothiazide, and metoprolol .  Hyperlipidemia Managed with rosuvastatin , recent dosage increase and aspirin  addition. - Continue rosuvastatin  10 mg daily. - Continue aspirin  as per cardiology recommendation.       Immunization due  Orders:   Flu vaccine trivalent PF, 6mos and older(Flulaval,Afluria,Fluarix,Fluzone)   Tdap vaccine greater than or equal to 7yo IM  Hyperlipidemia, unspecified hyperlipidemia type     Hypertension, unspecified type     Acute frontal sinusitis, recurrence not specified      Assessment and Plan Assessment & Plan       Follow-up: Return in about 3 months (around 07/01/2024) for physical, follow up with fasting lab 2 days prior.    Leslyn Monda, NP     [1]  Allergies Allergen Reactions   Ivp Dye [Iodinated Contrast Media]    Penicillins    "

## 2024-04-06 ENCOUNTER — Ambulatory Visit: Admitting: Psychiatry

## 2024-04-08 ENCOUNTER — Telehealth: Payer: Self-pay

## 2024-04-08 DIAGNOSIS — J0110 Acute frontal sinusitis, unspecified: Secondary | ICD-10-CM | POA: Insufficient documentation

## 2024-04-08 NOTE — Assessment & Plan Note (Signed)
 [name]

## 2024-04-08 NOTE — Assessment & Plan Note (Signed)
 [name]

## 2024-04-08 NOTE — Telephone Encounter (Signed)
 Left message to call the office on 04/09/24.

## 2024-04-19 ENCOUNTER — Encounter: Payer: Self-pay | Admitting: Nurse Practitioner

## 2024-04-20 ENCOUNTER — Other Ambulatory Visit: Payer: Self-pay | Admitting: Nurse Practitioner

## 2024-04-20 ENCOUNTER — Other Ambulatory Visit: Payer: Self-pay

## 2024-04-20 NOTE — Telephone Encounter (Signed)
Controlled substance database reviewed.  Medication sent to pharmacy.

## 2024-06-30 ENCOUNTER — Other Ambulatory Visit: Payer: PRIVATE HEALTH INSURANCE

## 2024-07-02 ENCOUNTER — Encounter: Payer: PRIVATE HEALTH INSURANCE | Admitting: Nurse Practitioner
